# Patient Record
Sex: Female | Born: 1960 | Race: Black or African American | Hispanic: No | Smoking: Never smoker
Health system: Southern US, Community
[De-identification: ages and names within clinical notes are randomized; demographics above are authoritative.]

## PROBLEM LIST (undated history)

## (undated) ENCOUNTER — Emergency Department (HOSPITAL_BASED_OUTPATIENT_CLINIC_OR_DEPARTMENT_OTHER)

## (undated) DIAGNOSIS — R51 Headache: Secondary | ICD-10-CM

## (undated) DIAGNOSIS — E0500 Thyrotoxicosis with diffuse goiter without thyrotoxic crisis or storm: Secondary | ICD-10-CM

## (undated) DIAGNOSIS — T1491XA Suicide attempt, initial encounter: Secondary | ICD-10-CM

## (undated) DIAGNOSIS — R000 Tachycardia, unspecified: Secondary | ICD-10-CM

## (undated) DIAGNOSIS — K219 Gastro-esophageal reflux disease without esophagitis: Secondary | ICD-10-CM

## (undated) DIAGNOSIS — I499 Cardiac arrhythmia, unspecified: Secondary | ICD-10-CM

## (undated) DIAGNOSIS — I10 Essential (primary) hypertension: Secondary | ICD-10-CM

## (undated) DIAGNOSIS — Z9289 Personal history of other medical treatment: Secondary | ICD-10-CM

## (undated) DIAGNOSIS — F32A Depression, unspecified: Secondary | ICD-10-CM

## (undated) DIAGNOSIS — R7303 Prediabetes: Secondary | ICD-10-CM

## (undated) DIAGNOSIS — E039 Hypothyroidism, unspecified: Secondary | ICD-10-CM

## (undated) DIAGNOSIS — M1990 Unspecified osteoarthritis, unspecified site: Secondary | ICD-10-CM

## (undated) DIAGNOSIS — D649 Anemia, unspecified: Secondary | ICD-10-CM

## (undated) DIAGNOSIS — F419 Anxiety disorder, unspecified: Secondary | ICD-10-CM

## (undated) DIAGNOSIS — F329 Major depressive disorder, single episode, unspecified: Secondary | ICD-10-CM

## (undated) DIAGNOSIS — F410 Panic disorder [episodic paroxysmal anxiety] without agoraphobia: Secondary | ICD-10-CM

## (undated) HISTORY — PX: BACK SURGERY: SHX140

## (undated) HISTORY — PX: ABDOMINAL HYSTERECTOMY: SHX81

## (undated) HISTORY — PX: POSTERIOR LUMBAR FUSION: SHX6036

## (undated) HISTORY — PX: TONSILLECTOMY: SUR1361

## (undated) HISTORY — PX: DILATION AND CURETTAGE OF UTERUS: SHX78

---

## 2009-01-13 ENCOUNTER — Ambulatory Visit (HOSPITAL_COMMUNITY): Admission: RE | Admit: 2009-01-13 | Discharge: 2009-01-13 | Payer: Self-pay | Admitting: Family Medicine

## 2009-01-18 ENCOUNTER — Ambulatory Visit: Payer: Self-pay | Admitting: Surgery

## 2009-01-18 ENCOUNTER — Ambulatory Visit (HOSPITAL_COMMUNITY): Admission: RE | Admit: 2009-01-18 | Discharge: 2009-01-18 | Payer: Self-pay | Admitting: Family Medicine

## 2009-01-18 ENCOUNTER — Encounter (INDEPENDENT_AMBULATORY_CARE_PROVIDER_SITE_OTHER): Payer: Self-pay | Admitting: Family Medicine

## 2009-02-04 ENCOUNTER — Emergency Department (HOSPITAL_COMMUNITY): Admission: EM | Admit: 2009-02-04 | Discharge: 2009-02-04 | Payer: Self-pay | Admitting: Family Medicine

## 2009-03-04 ENCOUNTER — Encounter: Admission: RE | Admit: 2009-03-04 | Discharge: 2009-05-25 | Payer: Self-pay | Admitting: Neurological Surgery

## 2009-09-07 ENCOUNTER — Ambulatory Visit (HOSPITAL_COMMUNITY): Admission: RE | Admit: 2009-09-07 | Discharge: 2009-09-07 | Payer: Self-pay | Admitting: Family Medicine

## 2010-03-21 ENCOUNTER — Ambulatory Visit (HOSPITAL_COMMUNITY): Admission: RE | Admit: 2010-03-21 | Discharge: 2010-03-21 | Payer: Self-pay | Admitting: Family Medicine

## 2010-03-27 ENCOUNTER — Ambulatory Visit (HOSPITAL_COMMUNITY): Admission: RE | Admit: 2010-03-27 | Discharge: 2010-03-27 | Payer: Self-pay | Admitting: Family Medicine

## 2010-04-22 ENCOUNTER — Observation Stay (HOSPITAL_COMMUNITY): Admission: EM | Admit: 2010-04-22 | Discharge: 2010-04-23 | Payer: Self-pay | Admitting: Emergency Medicine

## 2010-04-22 ENCOUNTER — Ambulatory Visit: Payer: Self-pay | Admitting: Internal Medicine

## 2010-08-14 ENCOUNTER — Encounter (HOSPITAL_COMMUNITY)
Admission: RE | Admit: 2010-08-14 | Discharge: 2010-10-21 | Payer: Self-pay | Source: Home / Self Care | Attending: Internal Medicine | Admitting: Internal Medicine

## 2010-11-12 ENCOUNTER — Encounter: Payer: Self-pay | Admitting: Cardiovascular Disease

## 2011-01-07 LAB — BASIC METABOLIC PANEL
BUN: 11 mg/dL (ref 6–23)
BUN: 19 mg/dL (ref 6–23)
CO2: 28 mEq/L (ref 19–32)
CO2: 29 mEq/L (ref 19–32)
GFR calc non Af Amer: >60 mL/min (ref >60)
GFR calc non Af Amer: >60 mL/min (ref >60)
Glucose, Bld: 107 mg/dL — ABNORMAL HIGH (ref 70–99)
Glucose, Bld: 111 mg/dL — ABNORMAL HIGH (ref 70–99)
Potassium: 3.7 mEq/L (ref 3.5–5.1)
Potassium: 4.2 mEq/L (ref 3.5–5.1)

## 2011-01-07 LAB — DIFFERENTIAL
Basophils Absolute: 0 10*3/uL (ref 0.0–0.1)
Basophils Absolute: 0.1 10*3/uL (ref 0.0–0.1)
Basophils Relative: 0 % (ref 0–1)
Basophils Relative: 1 % (ref 0–1)
Eosinophils Absolute: 0.1 10*3/uL (ref 0.0–0.7)
Eosinophils Absolute: 0.2 10*3/uL (ref 0.0–0.7)
Eosinophils Relative: 1 % (ref 0–5)
Eosinophils Relative: 2 % (ref 0–5)
Monocytes Absolute: 1.2 10*3/uL — ABNORMAL HIGH (ref 0.1–1.0)
Monocytes Absolute: 1.6 10*3/uL — ABNORMAL HIGH (ref 0.1–1.0)
Monocytes Relative: 16 % — ABNORMAL HIGH (ref 3–12)
Neutro Abs: 2.4 10*3/uL (ref 1.7–7.7)
Neutro Abs: 6.3 10*3/uL (ref 1.7–7.7)

## 2011-01-07 LAB — POCT CARDIAC MARKERS
CKMB, poc: <1 ng/mL — ABNORMAL LOW (ref 1.0–8.0)
Troponin i, poc: <0.05 ng/mL (ref 0.00–0.09)

## 2011-01-07 LAB — CK TOTAL AND CKMB (NOT AT ARMC)
CK, MB: 1.5 ng/mL (ref 0.3–4.0)
Relative Index: INVALID (ref 0.0–2.5)

## 2011-01-07 LAB — T4, FREE: Free T4: 4.07 ng/dL — ABNORMAL HIGH (ref 0.80–1.80)

## 2011-01-07 LAB — CBC
HCT: 39.1 % (ref 36.0–46.0)
HCT: 41.9 % (ref 36.0–46.0)
MCH: 27.5 pg (ref 26.0–34.0)
MCH: 27.6 pg (ref 26.0–34.0)
MCHC: 32.6 g/dL (ref 30.0–36.0)
MCHC: 32.6 g/dL (ref 30.0–36.0)
MCV: 84.4 fL (ref 78.0–100.0)
RDW: 12.4 % (ref 11.5–15.5)
RDW: 12.4 % (ref 11.5–15.5)

## 2011-01-07 LAB — TROPONIN I
Troponin I: 0.01 ng/mL (ref 0.00–0.06)
Troponin I: 0.01 ng/mL (ref 0.00–0.06)

## 2011-01-07 LAB — LIPID PANEL: VLDL: 9 mg/dL (ref 0–40)

## 2011-01-07 LAB — BRAIN NATRIURETIC PEPTIDE: Pro B Natriuretic peptide (BNP): <30 pg/mL (ref 0.0–100.0)

## 2011-01-07 LAB — TSH: TSH: 0.006 u[IU]/mL — ABNORMAL LOW (ref 0.350–4.500)

## 2012-06-16 ENCOUNTER — Encounter (HOSPITAL_COMMUNITY): Payer: Self-pay | Admitting: *Deleted

## 2012-06-16 ENCOUNTER — Emergency Department (HOSPITAL_COMMUNITY)
Admission: EM | Admit: 2012-06-16 | Discharge: 2012-06-16 | Disposition: A | Discharge status: Home / Self Care | Payer: 59 | Source: Home / Self Care | Attending: Emergency Medicine | Admitting: Emergency Medicine

## 2012-06-16 DIAGNOSIS — K047 Periapical abscess without sinus: Secondary | ICD-10-CM

## 2012-06-16 MED ORDER — PENICILLIN V POTASSIUM 500 MG PO TABS: 500.0000 mg | ORAL_TABLET | Freq: Four times a day (QID) | ORAL | Status: DC

## 2012-06-16 MED ORDER — PENICILLIN V POTASSIUM 500 MG PO TABS: 500.0000 mg | ORAL_TABLET | Freq: Four times a day (QID) | ORAL | Status: AC

## 2012-06-16 MED ORDER — HYDROCODONE-ACETAMINOPHEN 5-500 MG PO TABS: 1.0000 | ORAL_TABLET | Freq: Four times a day (QID) | ORAL | Status: AC | PRN

## 2012-06-16 MED ORDER — HYDROCODONE-ACETAMINOPHEN 5-500 MG PO TABS: 1.0000 | ORAL_TABLET | Freq: Four times a day (QID) | ORAL | Status: DC | PRN

## 2012-06-16 NOTE — ED Notes (Signed)
Pt  Reports  l  Upper  Jaw  Pain      Pt  Reports    Pain  Started  yest  -  Pt  States    Her  Dentist  Advised  Her to  See  An orthodontist     But  The  Orthodontist  Was  On vacation   -  Pt  States     She  Was  Told      By her  Dentist  To come  To  An urgent care      Today       Pt  States  She  Has   A  Broken   Tooth  In past

## 2012-06-16 NOTE — ED Provider Notes (Signed)
History     CSN: [id number]  Arrival date & time 06/16/12  1105   First MD Initiated Contact with Patient 06/16/12 1112      Chief Complaint  Patient presents with  . Jaw Pain    (Consider location/radiation/quality/duration/timing/severity/associated sxs/prior treatment) HPI Comments: Patient reports that she's been having discomfort and pain I particular upper tooth for several days. It was not until yesterday that she started feeling intense throbbing pain coming from this particular tooth has been sensitive to temperatures and feeling pain now to the left side of her face. She denies noticing any swelling so far. Denies any fevers or chills or headaches. She has been trying with some over-the-counter medicine with no improvement.  The history is provided by the patient.    Past Medical History  Diagnosis Date  . Back pain     Past Surgical History  Procedure Date  . Abdominal hysterectomy   . Tonsillectomy     No family history on file.  History  Substance Use Topics  . Smoking status: Never Smoker   . Smokeless tobacco: Not on file  . Alcohol Use: Yes    OB History    Grav Para Term Preterm Abortions TAB SAB Ect Mult Living                  Review of Systems  Constitutional: Negative for activity change and appetite change.  HENT: Positive for dental problem. Negative for congestion, facial swelling, trouble swallowing and neck pain.     Allergies  Peanut-containing drug products  Home Medications   Current Outpatient Rx  Name Route Sig Dispense Refill  . CELEXA PO Oral Take by mouth.    . SYNTHROID PO Oral Take by mouth.    Marland Kitchen PROPRANOLOL HCL PO Oral Take by mouth.    Marland Kitchen HYDROCODONE-ACETAMINOPHEN 5-500 MG PO TABS Oral Take 1 tablet by mouth every 6 (six) hours as needed for pain. 30 tablet 0  . PENICILLIN V POTASSIUM 500 MG PO TABS Oral Take 1 tablet (500 mg total) by mouth 4 (four) times daily. 30 tablet 0    BP 137/80  Pulse 78  Temp 98.6 F  (37 C) (Oral)  Resp 16  SpO2 97%  Physical Exam  Nursing note and vitals reviewed. Constitutional: She appears well-developed and well-nourished.  HENT:  Head: No trismus in the jaw.    Mouth/Throat: No oral lesions. Abnormal dentition. Dental caries present.    Neck: Neck supple.  Lymphadenopathy:    She has no cervical adenopathy.  Neurological: She is alert.  Skin: Skin is warm. No erythema.    ED Course  Procedures (including critical care time)  Labs Reviewed - No data to display No results found.   1. Abscessed tooth       MDM  Patient with a left premolar upper fractured tooth with no occlussal surface- have been referred to see Dr. says several endodontist apparently is on vacation. Have discussed what symptoms should warrant her to go to the emergency department at prescribe a course of penicillin and Lortab for pain control. It is reported by her that her dentist sent her here for dental management?        Jimmie Molly, MD 06/16/12 [phone]

## 2012-08-15 ENCOUNTER — Other Ambulatory Visit (HOSPITAL_COMMUNITY): Payer: Self-pay | Admitting: Family Medicine

## 2012-08-15 DIAGNOSIS — M79604 Pain in right leg: Secondary | ICD-10-CM

## 2012-08-15 DIAGNOSIS — M79605 Pain in left leg: Secondary | ICD-10-CM

## 2012-08-20 ENCOUNTER — Ambulatory Visit (HOSPITAL_COMMUNITY)
Admission: RE | Admit: 2012-08-20 | Discharge: 2012-08-20 | Disposition: A | Discharge status: Home / Self Care | Payer: 59 | Source: Ambulatory Visit | Attending: Family Medicine | Admitting: Family Medicine

## 2012-08-20 DIAGNOSIS — M79605 Pain in left leg: Secondary | ICD-10-CM

## 2012-08-20 DIAGNOSIS — M79604 Pain in right leg: Secondary | ICD-10-CM

## 2012-08-20 DIAGNOSIS — M48061 Spinal stenosis, lumbar region without neurogenic claudication: Secondary | ICD-10-CM | POA: Insufficient documentation

## 2012-08-20 DIAGNOSIS — M4310 Spondylolisthesis, site unspecified: Secondary | ICD-10-CM | POA: Insufficient documentation

## 2012-09-26 ENCOUNTER — Other Ambulatory Visit (HOSPITAL_COMMUNITY): Payer: Self-pay | Admitting: Family Medicine

## 2012-09-26 DIAGNOSIS — Z1231 Encounter for screening mammogram for malignant neoplasm of breast: Secondary | ICD-10-CM

## 2012-10-03 ENCOUNTER — Ambulatory Visit (HOSPITAL_COMMUNITY)
Admission: RE | Admit: 2012-10-03 | Discharge: 2012-10-03 | Disposition: A | Discharge status: Home / Self Care | Payer: 59 | Source: Ambulatory Visit | Attending: Family Medicine | Admitting: Family Medicine

## 2012-10-03 DIAGNOSIS — Z1231 Encounter for screening mammogram for malignant neoplasm of breast: Secondary | ICD-10-CM | POA: Insufficient documentation

## 2012-10-31 ENCOUNTER — Other Ambulatory Visit: Payer: Self-pay | Admitting: Neurological Surgery

## 2012-12-02 ENCOUNTER — Encounter (HOSPITAL_COMMUNITY): Payer: Self-pay | Admitting: Pharmacy Technician

## 2012-12-09 ENCOUNTER — Encounter (HOSPITAL_COMMUNITY): Payer: Self-pay

## 2012-12-09 ENCOUNTER — Ambulatory Visit (HOSPITAL_COMMUNITY)
Admission: RE | Admit: 2012-12-09 | Discharge: 2012-12-09 | Disposition: A | Discharge status: Home / Self Care | Payer: 59 | Source: Ambulatory Visit | Attending: Anesthesiology | Admitting: Anesthesiology

## 2012-12-09 ENCOUNTER — Encounter (HOSPITAL_COMMUNITY)
Admission: RE | Admit: 2012-12-09 | Discharge: 2012-12-09 | Disposition: A | Discharge status: Home / Self Care | Payer: 59 | Source: Ambulatory Visit | Attending: Neurological Surgery | Admitting: Neurological Surgery

## 2012-12-09 DIAGNOSIS — R000 Tachycardia, unspecified: Secondary | ICD-10-CM | POA: Insufficient documentation

## 2012-12-09 DIAGNOSIS — Z01810 Encounter for preprocedural cardiovascular examination: Secondary | ICD-10-CM | POA: Insufficient documentation

## 2012-12-09 DIAGNOSIS — Z01812 Encounter for preprocedural laboratory examination: Secondary | ICD-10-CM | POA: Insufficient documentation

## 2012-12-09 DIAGNOSIS — Z01818 Encounter for other preprocedural examination: Secondary | ICD-10-CM | POA: Insufficient documentation

## 2012-12-09 HISTORY — DX: Headache: R51

## 2012-12-09 HISTORY — DX: Anxiety disorder, unspecified: F41.9

## 2012-12-09 HISTORY — DX: Cardiac arrhythmia, unspecified: I49.9

## 2012-12-09 HISTORY — DX: Hypothyroidism, unspecified: E03.9

## 2012-12-09 LAB — BASIC METABOLIC PANEL
BUN: 12 mg/dL (ref 6–23)
CO2: 28 mEq/L (ref 19–32)
Chloride: 102 mEq/L (ref 96–112)
Glucose, Bld: 100 mg/dL — ABNORMAL HIGH (ref 70–99)
Potassium: 3.6 mEq/L (ref 3.5–5.1)
Sodium: 139 mEq/L (ref 135–145)

## 2012-12-09 LAB — ABO/RH: ABO/RH(D): O POS

## 2012-12-09 LAB — CBC
HCT: 44.8 % (ref 36.0–46.0)
Hemoglobin: 14.2 g/dL (ref 12.0–15.0)
MCH: 26.9 pg (ref 26.0–34.0)
MCHC: 31.7 g/dL (ref 30.0–36.0)
MCV: 84.8 fL (ref 78.0–100.0)
RBC: 5.28 MIL/uL — ABNORMAL HIGH (ref 3.87–5.11)

## 2012-12-09 LAB — TYPE AND SCREEN: ABO/RH(D): O POS

## 2012-12-09 NOTE — Pre-Procedure Instructions (Signed)
Nancy Gibbs  12/09/2012   Your procedure is scheduled on:  Feb. 25, 2014  Report to [name] at 5:30 AM.  Call this number if you have problems the morning of surgery: [phone]   Remember:   Do not eat food or drink liquids after midnight.   Take these medicines the morning of surgery with A SIP OF WATER: citalopram(celexa), hydrocodone(vicodin), levothyroxine(synthroid), propranolol(inderal)   Do not wear jewelry, make-up or nail polish.  Do not wear lotions, powders, or perfumes. You may wear deodorant.  Do not shave 48 hours prior to surgery. Men may shave face and neck.  Do not bring valuables to the hospital.  Contacts, dentures or bridgework may not be worn into surgery.  Leave suitcase in the car. After surgery it may be brought to your room.  For patients admitted to the hospital, checkout time is 11:00 AM the day of  discharge.   Patients discharged the day of surgery will not be allowed to drive  home.  Name and phone number of your driver:   Special Instructions: Shower using CHG 2 nights before surgery and the night before surgery.  If you shower the day of surgery use CHG.  Use special wash - you have one bottle of CHG for all showers.  You should use approximately 1/3 of the bottle for each shower.   Please read over the following fact sheets that you were given: Pain Booklet, Coughing and Deep Breathing, Blood Transfusion Information and Surgical Site Infection Prevention

## 2012-12-15 MED ORDER — CEFAZOLIN SODIUM-DEXTROSE 2-3 GM-% IV SOLR
2.0000 g | INTRAVENOUS | Status: AC
  Administered 2012-12-16: 2 g via INTRAVENOUS
  Filled 2012-12-15: qty 50

## 2012-12-16 ENCOUNTER — Inpatient Hospital Stay (HOSPITAL_COMMUNITY): Payer: 59 | Admitting: Anesthesiology

## 2012-12-16 ENCOUNTER — Inpatient Hospital Stay (HOSPITAL_COMMUNITY): Payer: 59

## 2012-12-16 ENCOUNTER — Encounter (HOSPITAL_COMMUNITY)
Admission: RE | Disposition: A | Discharge status: Home / Self Care | Payer: Self-pay | Source: Ambulatory Visit | Attending: Neurological Surgery

## 2012-12-16 ENCOUNTER — Ambulatory Visit (HOSPITAL_COMMUNITY)
Admission: RE | Admit: 2012-12-16 | Discharge: 2012-12-18 | Disposition: A | Discharge status: Home / Self Care | Payer: 59 | Source: Ambulatory Visit | Attending: Neurological Surgery | Admitting: Neurological Surgery

## 2012-12-16 ENCOUNTER — Encounter (HOSPITAL_COMMUNITY): Payer: Self-pay | Admitting: Anesthesiology

## 2012-12-16 ENCOUNTER — Encounter (HOSPITAL_COMMUNITY): Payer: Self-pay | Admitting: *Deleted

## 2012-12-16 DIAGNOSIS — M4316 Spondylolisthesis, lumbar region: Secondary | ICD-10-CM

## 2012-12-16 DIAGNOSIS — M48061 Spinal stenosis, lumbar region without neurogenic claudication: Secondary | ICD-10-CM | POA: Insufficient documentation

## 2012-12-16 DIAGNOSIS — M4310 Spondylolisthesis, site unspecified: Principal | ICD-10-CM | POA: Insufficient documentation

## 2012-12-16 SURGERY — POSTERIOR LUMBAR FUSION 1 LEVEL
Anesthesia: General | Site: Back | Wound class: Clean

## 2012-12-16 MED ORDER — ACETAMINOPHEN 650 MG RE SUPP: 650.0000 mg | RECTAL | Status: DC | PRN

## 2012-12-16 MED ORDER — GLYCOPYRROLATE 0.2 MG/ML IJ SOLN
INTRAMUSCULAR | Status: DC | PRN
  Administered 2012-12-16: 0.6 mg via INTRAVENOUS

## 2012-12-16 MED ORDER — MENTHOL 3 MG MT LOZG: 1.0000 | LOZENGE | OROMUCOSAL | Status: DC | PRN

## 2012-12-16 MED ORDER — ONDANSETRON HCL 4 MG/2ML IJ SOLN: 4.0000 mg | Freq: Once | INTRAMUSCULAR | Status: DC | PRN

## 2012-12-16 MED ORDER — LIDOCAINE HCL (CARDIAC) 20 MG/ML IV SOLN
INTRAVENOUS | Status: DC | PRN
  Administered 2012-12-16: 70 mg via INTRAVENOUS

## 2012-12-16 MED ORDER — PHENOL 1.4 % MT LIQD: 1.0000 | OROMUCOSAL | Status: DC | PRN

## 2012-12-16 MED ORDER — ZOLPIDEM TARTRATE 5 MG PO TABS: 10.0000 mg | ORAL_TABLET | Freq: Every evening | ORAL | Status: DC | PRN

## 2012-12-16 MED ORDER — LACTATED RINGERS IV SOLN
INTRAVENOUS | Status: DC | PRN
  Administered 2012-12-16: 07:00:00 via INTRAVENOUS

## 2012-12-16 MED ORDER — HYDROMORPHONE HCL PF 1 MG/ML IJ SOLN
INTRAMUSCULAR | Status: AC
  Filled 2012-12-16: qty 1

## 2012-12-16 MED ORDER — SODIUM CHLORIDE 0.9 % IJ SOLN
3.0000 mL | Freq: Two times a day (BID) | INTRAMUSCULAR | Status: DC
  Administered 2012-12-17 – 2012-12-18 (×3): 3 mL via INTRAVENOUS

## 2012-12-16 MED ORDER — SODIUM CHLORIDE 0.9 % IR SOLN
Status: DC | PRN
  Administered 2012-12-16: 09:00:00

## 2012-12-16 MED ORDER — PROPRANOLOL HCL 1 MG/ML IV SOLN
INTRAVENOUS | Status: DC | PRN
  Administered 2012-12-16: .25 mg via INTRAVENOUS

## 2012-12-16 MED ORDER — HYDROMORPHONE HCL PF 1 MG/ML IJ SOLN
0.5000 mg | INTRAMUSCULAR | Status: DC | PRN
  Administered 2012-12-16 – 2012-12-17 (×2): 1 mg via INTRAVENOUS
  Filled 2012-12-16 (×2): qty 1

## 2012-12-16 MED ORDER — BISACODYL 10 MG RE SUPP: 10.0000 mg | Freq: Every day | RECTAL | Status: DC | PRN

## 2012-12-16 MED ORDER — FLEET ENEMA 7-19 GM/118ML RE ENEM
1.0000 | ENEMA | Freq: Once | RECTAL | Status: AC | PRN
  Filled 2012-12-16: qty 1

## 2012-12-16 MED ORDER — LIDOCAINE-EPINEPHRINE 1 %-1:100000 IJ SOLN
INTRAMUSCULAR | Status: DC | PRN
  Administered 2012-12-16: 10 mL via INTRADERMAL

## 2012-12-16 MED ORDER — SENNA 8.6 MG PO TABS
1.0000 | ORAL_TABLET | Freq: Two times a day (BID) | ORAL | Status: DC
  Administered 2012-12-16 – 2012-12-18 (×3): 8.6 mg via ORAL
  Filled 2012-12-16 (×6): qty 1

## 2012-12-16 MED ORDER — SODIUM CHLORIDE 0.9 % IV SOLN
INTRAVENOUS | Status: AC
  Filled 2012-12-16: qty 500

## 2012-12-16 MED ORDER — DEXAMETHASONE SODIUM PHOSPHATE 10 MG/ML IJ SOLN
10.0000 mg | Freq: Once | INTRAMUSCULAR | Status: AC
  Administered 2012-12-16: 10 mg via INTRAVENOUS

## 2012-12-16 MED ORDER — POLYETHYLENE GLYCOL 3350 17 G PO PACK
17.0000 g | PACK | Freq: Every day | ORAL | Status: DC
  Administered 2012-12-17 – 2012-12-18 (×2): 17 g via ORAL
  Filled 2012-12-16 (×3): qty 1

## 2012-12-16 MED ORDER — HYDROMORPHONE HCL PF 1 MG/ML IJ SOLN
0.2500 mg | INTRAMUSCULAR | Status: DC | PRN
  Administered 2012-12-16 (×3): 0.5 mg via INTRAVENOUS

## 2012-12-16 MED ORDER — SODIUM CHLORIDE 0.9 % IV SOLN: INTRAVENOUS | Status: DC

## 2012-12-16 MED ORDER — PROPOFOL 10 MG/ML IV BOLUS
INTRAVENOUS | Status: DC | PRN
  Administered 2012-12-16: 200 mg via INTRAVENOUS

## 2012-12-16 MED ORDER — SODIUM CHLORIDE 0.9 % IV SOLN
INTRAVENOUS | Status: DC | PRN
  Administered 2012-12-16: 11:00:00 via INTRAVENOUS

## 2012-12-16 MED ORDER — POLYETHYLENE GLYCOL 3350 17 G PO PACK
17.0000 g | PACK | Freq: Every day | ORAL | Status: DC | PRN
  Filled 2012-12-16: qty 1

## 2012-12-16 MED ORDER — PHENYLEPHRINE HCL 10 MG/ML IJ SOLN
INTRAMUSCULAR | Status: DC | PRN
  Administered 2012-12-16 (×2): 80 ug via INTRAVENOUS
  Administered 2012-12-16: 120 ug via INTRAVENOUS

## 2012-12-16 MED ORDER — THROMBIN 20000 UNITS EX SOLR
CUTANEOUS | Status: DC | PRN
  Administered 2012-12-16: 09:00:00 via TOPICAL

## 2012-12-16 MED ORDER — PROPRANOLOL HCL 10 MG PO TABS
10.0000 mg | ORAL_TABLET | ORAL | Status: DC
  Administered 2012-12-18: 10 mg via ORAL
  Filled 2012-12-16 (×2): qty 1

## 2012-12-16 MED ORDER — DOCUSATE SODIUM 100 MG PO CAPS
100.0000 mg | ORAL_CAPSULE | Freq: Two times a day (BID) | ORAL | Status: DC
  Administered 2012-12-16 – 2012-12-18 (×4): 100 mg via ORAL
  Filled 2012-12-16 (×4): qty 1

## 2012-12-16 MED ORDER — BUPIVACAINE HCL (PF) 0.5 % IJ SOLN
INTRAMUSCULAR | Status: DC | PRN
  Administered 2012-12-16: 20 mL
  Administered 2012-12-16: 10 mL

## 2012-12-16 MED ORDER — 0.9 % SODIUM CHLORIDE (POUR BTL) OPTIME
TOPICAL | Status: DC | PRN
  Administered 2012-12-16: 1000 mL

## 2012-12-16 MED ORDER — ZOLPIDEM TARTRATE 5 MG PO TABS
5.0000 mg | ORAL_TABLET | Freq: Every evening | ORAL | Status: DC | PRN
Start: 2012-12-16 — End: 2012-12-18

## 2012-12-16 MED ORDER — METHOCARBAMOL 500 MG PO TABS
500.0000 mg | ORAL_TABLET | Freq: Four times a day (QID) | ORAL | Status: DC | PRN
  Administered 2012-12-17 – 2012-12-18 (×4): 500 mg via ORAL
  Filled 2012-12-16 (×4): qty 1

## 2012-12-16 MED ORDER — CITALOPRAM HYDROBROMIDE 40 MG PO TABS
40.0000 mg | ORAL_TABLET | Freq: Every day | ORAL | Status: DC
  Administered 2012-12-17 – 2012-12-18 (×2): 40 mg via ORAL
  Filled 2012-12-16 (×3): qty 1

## 2012-12-16 MED ORDER — BACITRACIN 50000 UNITS IM SOLR
INTRAMUSCULAR | Status: AC
  Filled 2012-12-16: qty 1

## 2012-12-16 MED ORDER — NEOSTIGMINE METHYLSULFATE 1 MG/ML IJ SOLN
INTRAMUSCULAR | Status: DC | PRN
  Administered 2012-12-16: 3 mg via INTRAVENOUS

## 2012-12-16 MED ORDER — LIDOCAINE HCL 4 % MT SOLN
OROMUCOSAL | Status: DC | PRN
  Administered 2012-12-16: 4 mL via TOPICAL

## 2012-12-16 MED ORDER — ALUM & MAG HYDROXIDE-SIMETH 200-200-20 MG/5ML PO SUSP: 30.0000 mL | Freq: Four times a day (QID) | ORAL | Status: DC | PRN

## 2012-12-16 MED ORDER — SODIUM CHLORIDE 0.9 % IV SOLN: 250.0000 mL | INTRAVENOUS | Status: DC

## 2012-12-16 MED ORDER — SODIUM CHLORIDE 0.9 % IV SOLN
10.0000 mg | INTRAVENOUS | Status: DC | PRN
  Administered 2012-12-16: 10 ug/min via INTRAVENOUS

## 2012-12-16 MED ORDER — ROCURONIUM BROMIDE 100 MG/10ML IV SOLN
INTRAVENOUS | Status: DC | PRN
  Administered 2012-12-16 (×3): 20 mg via INTRAVENOUS
  Administered 2012-12-16: 30 mg via INTRAVENOUS

## 2012-12-16 MED ORDER — ONDANSETRON HCL 4 MG/2ML IJ SOLN
4.0000 mg | INTRAMUSCULAR | Status: DC | PRN
  Administered 2012-12-17: 4 mg via INTRAVENOUS
  Filled 2012-12-16: qty 2

## 2012-12-16 MED ORDER — METHOCARBAMOL 100 MG/ML IJ SOLN
500.0000 mg | Freq: Four times a day (QID) | INTRAVENOUS | Status: DC | PRN
  Filled 2012-12-16: qty 5

## 2012-12-16 MED ORDER — FENTANYL CITRATE 0.05 MG/ML IJ SOLN
INTRAMUSCULAR | Status: DC | PRN
  Administered 2012-12-16 (×3): 50 ug via INTRAVENOUS
  Administered 2012-12-16: 100 ug via INTRAVENOUS

## 2012-12-16 MED ORDER — ONDANSETRON HCL 4 MG/2ML IJ SOLN
INTRAMUSCULAR | Status: DC | PRN
  Administered 2012-12-16: 4 mg via INTRAVENOUS

## 2012-12-16 MED ORDER — CEFAZOLIN SODIUM 1-5 GM-% IV SOLN
1.0000 g | Freq: Three times a day (TID) | INTRAVENOUS | Status: AC
  Administered 2012-12-16 – 2012-12-17 (×2): 1 g via INTRAVENOUS
  Filled 2012-12-16 (×2): qty 50

## 2012-12-16 MED ORDER — SODIUM CHLORIDE 0.9 % IJ SOLN: 3.0000 mL | INTRAMUSCULAR | Status: DC | PRN

## 2012-12-16 MED ORDER — LEVOTHYROXINE SODIUM 50 MCG PO TABS
50.0000 ug | ORAL_TABLET | Freq: Every day | ORAL | Status: DC
  Administered 2012-12-17 – 2012-12-18 (×2): 50 ug via ORAL
  Filled 2012-12-16 (×3): qty 1

## 2012-12-16 MED ORDER — OXYCODONE-ACETAMINOPHEN 5-325 MG PO TABS
1.0000 | ORAL_TABLET | ORAL | Status: DC | PRN
  Administered 2012-12-16 – 2012-12-18 (×5): 2 via ORAL
  Filled 2012-12-16 (×5): qty 2

## 2012-12-16 MED ORDER — ACETAMINOPHEN 325 MG PO TABS: 650.0000 mg | ORAL_TABLET | ORAL | Status: DC | PRN

## 2012-12-16 MED ORDER — MIDAZOLAM HCL 5 MG/5ML IJ SOLN
INTRAMUSCULAR | Status: DC | PRN
  Administered 2012-12-16: 2 mg via INTRAVENOUS

## 2012-12-16 SURGICAL SUPPLY — 56 items
BAG DECANTER FOR FLEXI CONT (MISCELLANEOUS) ×2 IMPLANT
BLADE SURG ROTATE 9660 (MISCELLANEOUS) IMPLANT
BUR MATCHSTICK NEURO 3.0 LAGG (BURR) ×2 IMPLANT
CANISTER SUCTION 2500CC (MISCELLANEOUS) ×2 IMPLANT
CLOTH BEACON ORANGE TIMEOUT ST (SAFETY) ×2 IMPLANT
CONT SPEC 4OZ CLIKSEAL STRL BL (MISCELLANEOUS) ×4 IMPLANT
COVER BACK TABLE 24X17X13 BIG (DRAPES) IMPLANT
COVER TABLE BACK 60X90 (DRAPES) ×2 IMPLANT
DECANTER SPIKE VIAL GLASS SM (MISCELLANEOUS) ×2 IMPLANT
DERMABOND ADVANCED (GAUZE/BANDAGES/DRESSINGS) ×1
DERMABOND ADVANCED .7 DNX12 (GAUZE/BANDAGES/DRESSINGS) ×1 IMPLANT
DRAPE C-ARM 42X72 X-RAY (DRAPES) ×4 IMPLANT
DRAPE LAPAROTOMY 100X72X124 (DRAPES) ×2 IMPLANT
DRAPE POUCH INSTRU U-SHP 10X18 (DRAPES) ×2 IMPLANT
DRAPE PROXIMA HALF (DRAPES) IMPLANT
DURAPREP 26ML APPLICATOR (WOUND CARE) ×2 IMPLANT
ELECT REM PT RETURN 9FT ADLT (ELECTROSURGICAL) ×2
ELECTRODE REM PT RTRN 9FT ADLT (ELECTROSURGICAL) ×1 IMPLANT
GAUZE SPONGE 4X4 16PLY XRAY LF (GAUZE/BANDAGES/DRESSINGS) IMPLANT
GLOVE BIOGEL PI IND STRL 8.5 (GLOVE) ×2 IMPLANT
GLOVE BIOGEL PI INDICATOR 8.5 (GLOVE) ×2
GLOVE ECLIPSE 7.5 STRL STRAW (GLOVE) ×2 IMPLANT
GLOVE ECLIPSE 8.5 STRL (GLOVE) ×4 IMPLANT
GLOVE EXAM NITRILE LRG STRL (GLOVE) IMPLANT
GLOVE EXAM NITRILE MD LF STRL (GLOVE) IMPLANT
GLOVE EXAM NITRILE XL STR (GLOVE) IMPLANT
GLOVE EXAM NITRILE XS STR PU (GLOVE) IMPLANT
GOWN BRE IMP SLV AUR LG STRL (GOWN DISPOSABLE) IMPLANT
GOWN BRE IMP SLV AUR XL STRL (GOWN DISPOSABLE) IMPLANT
GOWN STRL REIN 2XL LVL4 (GOWN DISPOSABLE) ×4 IMPLANT
KIT BASIN OR (CUSTOM PROCEDURE TRAY) ×2 IMPLANT
KIT ROOM TURNOVER OR (KITS) ×2 IMPLANT
NEEDLE HYPO 22GX1.5 SAFETY (NEEDLE) ×2 IMPLANT
NS IRRIG 1000ML POUR BTL (IV SOLUTION) ×2 IMPLANT
PACK LAMINECTOMY NEURO (CUSTOM PROCEDURE TRAY) ×2 IMPLANT
PAD ARMBOARD 7.5X6 YLW CONV (MISCELLANEOUS) ×6 IMPLANT
PATTIES SURGICAL .5 X1 (DISPOSABLE) ×2 IMPLANT
PEEK PLIF NOVEL 9X25X12 (Peek) ×4 IMPLANT
ROD 35MM (Rod) ×2 IMPLANT
ROD TI 5X30MM (Rod) ×2 IMPLANT
SCREW 40MM (Screw) ×8 IMPLANT
SCREW SET SPINAL STD HEXALOBE (Screw) ×8 IMPLANT
SPONGE GAUZE 4X4 12PLY (GAUZE/BANDAGES/DRESSINGS) ×2 IMPLANT
SPONGE LAP 4X18 X RAY DECT (DISPOSABLE) IMPLANT
SPONGE SURGIFOAM ABS GEL 100 (HEMOSTASIS) ×2 IMPLANT
STRIP VITOSS 25X100X4MM (Neuro Prosthesis/Implant) ×2 IMPLANT
SUT VIC AB 1 CT1 18XBRD ANBCTR (SUTURE) ×1 IMPLANT
SUT VIC AB 1 CT1 8-18 (SUTURE) ×1
SUT VIC AB 2-0 CP2 18 (SUTURE) ×2 IMPLANT
SUT VIC AB 3-0 SH 8-18 (SUTURE) ×2 IMPLANT
SYR 20ML ECCENTRIC (SYRINGE) ×2 IMPLANT
TOWEL OR 17X24 6PK STRL BLUE (TOWEL DISPOSABLE) ×2 IMPLANT
TOWEL OR 17X26 10 PK STRL BLUE (TOWEL DISPOSABLE) ×2 IMPLANT
TRAP SPECIMEN MUCOUS 40CC (MISCELLANEOUS) ×2 IMPLANT
TRAY FOLEY CATH 14FRSI W/METER (CATHETERS) ×2 IMPLANT
WATER STERILE IRR 1000ML POUR (IV SOLUTION) ×2 IMPLANT

## 2012-12-16 NOTE — Anesthesia Procedure Notes (Signed)
Procedure Name: Intubation Date/Time: 12/16/2012 7:47 AM Performed by: Quentin Ore Pre-anesthesia Checklist: Patient identified, Emergency Drugs available, Suction available, Patient being monitored and Timeout performed Patient Re-evaluated:Patient Re-evaluated prior to inductionOxygen Delivery Method: Circle system utilized Preoxygenation: Pre-oxygenation with 100% oxygen Intubation Type: IV induction Ventilation: Mask ventilation without difficulty Laryngoscope Size: Mac and 3 Grade View: Grade II Tube type: Oral Tube size: 7.0 mm Number of attempts: 1 Airway Equipment and Method: Stylet and LTA kit utilized Placement Confirmation: ETT inserted through vocal cords under direct vision,  positive ETCO2 and breath sounds checked- equal and bilateral Secured at: 20 cm Tube secured with: Tape Dental Injury: Teeth and Oropharynx as per pre-operative assessment

## 2012-12-16 NOTE — H&P (Signed)
Vernella Niznik is an 52 y.o. female.   Chief Complaint: Back and bilateral lower extremity pain weakness in lower extremities with fatigue. 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  Past Medical History  Diagnosis Date  . Back pain   . Dysrhythmia     tachycardia  . Hypothyroidism   . Headache     if meds not taken at specific time  . Anxiety     Past Surgical History  Procedure Laterality Date  . Abdominal hysterectomy    . Tonsillectomy      History reviewed. No pertinent family history. Social History:  reports that she has never smoked. She does not have any smokeless tobacco history on file. She reports that  drinks alcohol. She reports that she does not use illicit drugs.  Allergies:  Allergies  Allergen Reactions  . Peanut-Containing Drug Products Anaphylaxis  . Pineapple Anaphylaxis  . Other     Some soaps and detergents, break out skin rashes.  Pt will bring own soap.    Medications Prior to Admission  Medication Sig Dispense Refill  . citalopram (CELEXA) 40 MG tablet Take 40 mg by mouth daily.      Marland Kitchen EPINEPHrine (EPIPEN) 0.3 mg/0.3 mL DEVI Inject 0.3 mg into the muscle once.      Marland Kitchen HYDROcodone-acetaminophen (VICODIN) 5-500 MG per tablet Take 1 tablet by mouth every 6 (six) hours as needed for pain. Pain      . levothyroxine (SYNTHROID, LEVOTHROID) 50 MCG tablet Take 50 mcg by mouth daily.      . propranolol (INDERAL) 10 MG tablet Take 10 mg by mouth every other day.      . zolpidem (AMBIEN) 10 MG tablet Take 10 mg by mouth at bedtime as needed for sleep. sleep      . polyethylene glycol (MIRALAX / GLYCOLAX) packet Take 17 g by mouth daily.        No results found for this or any previous visit  (from the past 48 hour(s)). No results found.  Review of Systems  HENT: Negative.   Eyes: Negative.   Respiratory: Negative.   Cardiovascular: Negative.   Gastrointestinal: Negative.   Genitourinary: Negative.   Musculoskeletal: Positive for back pain.  Skin: Negative.   Neurological: Positive for tingling, focal weakness and weakness.  Endo/Heme/Allergies: Negative.   Psychiatric/Behavioral: Negative.     Blood pressure 140/96, pulse 60, temperature 97.6 F (36.4 C), temperature source Oral, resp. rate 18, SpO2 99.00%. Physical Exam  Constitutional: She is oriented to person, place, and time. She appears well-developed and well-nourished.  HENT:  Head: Normocephalic and atraumatic.  Eyes: Conjunctivae and EOM are normal. Pupils are equal, round, and reactive to light.  Neck: Normal range of motion. Neck supple.  Cardiovascular: Normal rate and regular rhythm.   GI: Soft. Bowel sounds are normal.  Musculoskeletal:  Back pain with radiation to both buttocks and posterior lower extremities. Tender to palpation in midline.  Neurological: She is alert and oriented to person, place, and time. She has normal reflexes.  Skin: Skin is warm and dry.  Psychiatric: She has a normal mood and affect. Her behavior is normal. Judgment and thought content normal.     Assessment/Plan  I have visited with her back in May  of 2010.  At that time I noted that she had a mild Grade I spondylolisthesis with some lateral recess stenosis and mostly she was having back pain with some radiation into her hip and leg on the right side.  I advised conservative treatment and she and been seen by Dr. Alvester Morin and has had some epidural steroid injections done in the lower lumbar spine.  She notes that these have given her some relief during that time, but more recently she has had episodes of the legs giving way and she has had a couple of falls.  She notes her back pain and leg pain have been increasing also.  She  is sent via the courtesy of Dr. Carolin Coy for further evaluation of this problem having had a recent MRI in October of 2013.  I have had the opportunity to review this and this demonstrates that she now has advanced Grade I spondylolisthesis and she has severe central stenosis, in addition to biforaminal stenosis.  There is marked hypertrophy of the superior articular process of L5 which causes the lateral recess stenosis mostly.    To further her workup today in the office, I obtained a new flexion and extension lumbar spine x-ray.  This demonstrates that she has 10 mm. of anterolisthesis of L4 on 5 that does not change much between flexion and extension perhaps at most 1 mm. or so.  The alignment in the coronal and sagittal planes is quite good, save for the spondylolisthesis at L4-L5.    On examination I note that she walks without any antalgia.  She does have some mild tibialis anterior weakness on testing both these muscle groups.  Her flexion forward is intact to +90 degrees.  Palpation, however, and percussion do reproduce some localized tenderness in her back.    IMPRESSION:    Jaqlyn Gruenhagen has a Grade I spondylolisthesis that has been treated conservatively.  A recent MRI demonstrates that there has been some advancement of the spondylitic and stenotic process at this level.  She is now having increasing neurologic symptoms.  She has had extensive efforts at conservative management with steroid injections and back in 2010 she was evaluated at [facility] with a McKenzie exercise program.  At this time, I do not fee that further conservative management is likely to yield much improvement, although if she could tolerate it, she could certainly consider this.  She is here because she notes that she is having to miss work frequently, the pain symptoms are increasing and her functional status is decreasing substantially.  I indicated that the definitiverepair of this  would require surgical decompression of the spondylolisthesis at L4-L5 with interbody grafting and placement of spacers into the disc space at L4-L5, then placement of pedicle screws in the vertebrae at L4-L5 connecting them with short rods.  I demonstrated the procedure and process in the model in the office.  I indicated that surgery is, indeed, a major undertaking.  The patient would be required to fuse that is grown bone across one disc space to the other in order to form a fusion.  She otherwise appears to have good health and should be fully capable of doing this, but the surgery does certainly have certain risks.  These include the possibility of nerve injury, the possibility that she wouldn't heal, which would require further surgery in order to stabilize and get her spine to heal.  There is a possibility always of infection.  All of these  risks are very small and given the fact that she is fairly conservative efforts, I believe that the surgical intervention is worth the risk.  Nonetheless, at this time, because she is neurologically intact, surgery is elective in nature.  The main reason for proceeding is because of poor management of the symptoms and this is what appears to be happening in her case.    Kincaid Tiger J 12/16/2012, 7:34 AM

## 2012-12-16 NOTE — Addendum Note (Signed)
Addendum created 12/16/12 1230 by Quentin Ore, CRNA   Modules edited: Anesthesia Medication Administration

## 2012-12-16 NOTE — Progress Notes (Signed)
Patient ID: Nancy Gibbs, female   DOB: 1961/10/20, 52 y.o.   MRN: [id number] I will signs are stable. Patient appears comfortable. Describes no leg pain. Back has some modest soreness. Motor function appears good and lower extremities. Mobilize as tolerated.

## 2012-12-16 NOTE — Progress Notes (Signed)
Informed Dr. Jacklynn Bue that pt. Didn't take beta blocker this am. Pule rate 50, blood pressure 140/96. He stated not to give beta blocker.

## 2012-12-16 NOTE — Anesthesia Preprocedure Evaluation (Addendum)
Anesthesia Evaluation  Patient identified by MRN, date of birth, ID band Patient awake    Reviewed: Allergy & Precautions, H&P , NPO status , Patient's Chart, lab work & pertinent test results, reviewed documented beta blocker date and time   Airway Mallampati: II TM Distance: >3 FB Neck ROM: full    Dental  (+) Teeth Intact   Pulmonary          Cardiovascular + dysrhythmias Rhythm:regular Rate:Normal     Neuro/Psych  Headaches, Anxiety    GI/Hepatic   Endo/Other  Hypothyroidism   Renal/GU      Musculoskeletal   Abdominal   Peds  Hematology   Anesthesia Other Findings   Reproductive/Obstetrics                         Anesthesia Physical Anesthesia Plan  ASA: II  Anesthesia Plan: General   Post-op Pain Management:    Induction: Intravenous  Airway Management Planned: Oral ETT  Additional Equipment:   Intra-op Plan:   Post-operative Plan: Extubation in OR  Informed Consent: I have reviewed the patients History and Physical, chart, labs and discussed the procedure including the risks, benefits and alternatives for the proposed anesthesia with the patient or authorized representative who has indicated his/her understanding and acceptance.     Plan Discussed with: CRNA, Anesthesiologist and Surgeon  Anesthesia Plan Comments:         Anesthesia Quick Evaluation

## 2012-12-16 NOTE — Transfer of Care (Signed)
Immediate Anesthesia Transfer of Care Note  Patient: Nancy Gibbs  Procedure(s) Performed: Procedure(s) with comments: POSTERIOR LUMBAR FUSION 1 LEVEL (N/A) - L4-5 Decompression/Fusion/Posterior lumbar interbody fusion/Peek spacers/Pedicle screw fixation, C ARM  Patient Location: PACU  Anesthesia Type:General  Level of Consciousness: sedated  Airway & Oxygen Therapy: Patient Spontanous Breathing and Patient connected to nasal cannula oxygen  Post-op Assessment: Report given to PACU RN, Post -op Vital signs reviewed and stable and Patient moving all extremities X 4  Post vital signs: Reviewed and stable  Complications: No apparent anesthesia complications

## 2012-12-16 NOTE — Op Note (Signed)
Preoperative diagnosis: L4-5 degenerative spondylolisthesis with central and foraminal stenosis of L4 and L5 nerve roots with radiculopathy., Back pain. Postoperative diagnosis: Same  Procedure: L4-L5 decompressive laminectomy decompression of L4 and L5 nerve roots, posterior lumbar interbody arthrodesis with peek spacers local autograft and allograft, pedicle screw fixation L4-L5, posterior lateral arthrodesis L4-L5  Surgeon: Barnett Abu M.D.  Asst.: Tressie Stalker M.D.  Indications: Patient is Nancy Gibbs is a 52 y.o. female who who's had significant back pain and lumbar radiculopathy for over a years period time. A lumbar myelogram demonstrates advanced spondylolisthesis with high-grade canal stenosis. she was advised regarding surgical intervention.  Procedure: The patient was brought to the operating room supine on a stretcher. After the smooth induction of general endotracheal anesthesia she was turned prone and the back was prepped with alcohol and DuraPrep. The back was then draped sterilely. A midline incision was created and carried down to the lumbar dorsal fascia. A localizing radiograph identified the L4 and L5 spinous processes. A subligamentous dissection was created at L4 and L5 to expose the interlaminar space at L4 and L5 and the facet joints over the L4-L5 interspace. Laminotomies were were then created removing the entire inferior margin of the lamina of L4 including the inferior facet at the L4-L5 joint. The yellow ligament was taken up and the common dural tube was exposed along with the L4 nerve root superiorly, and the L5 nerve root inferiorly, the disc space was exposed and epidural veins in this region were cauterized and divided. The L4 nerve roots and the L5 nerve root were dissected with care taken to protect them, and decompress severe stenosis at each of the root exit zones. On the right side particularly there was severe disfiguration of the facet joint which caused  scar of dictation to occur along the L4 nerve root superiorly and then the common dural tube and the take off of the L5 nerve root inferiorly. This was carefully dissected free with a combination of a high-speed drill and small Kerrison punches. The dissection was much more than was require for simple performance of an interbody fusion.   The disc space was opened and a combination of curettes and rongeurs was used to evacuate the disc space fully. The endplates were removed using sharp curettes. An interbody spacer was placed to distract the disc space while the contralateral discectomy was performed. When the entirety of the disc was removed and the endplates were prepared final sizing of the disc space was obtained 12 mm peek spacers were chosen and packed with autograft and allograft and placed into the interspace. The remainder of the interspace was packed with autograft and allograft. Pedicle entry sites were then chosen using fluoroscopic guidance and 6.5 x 40 mm screws were placed in L4 and 6.5 x 40 mm screws were placed in L5. The lateral gutters were decorticated and graft was packed in the posterolateral gutters between L4 and L5. Final radiographs were obtained after placing appropriately sized rods between the pedicle screws at L4-L5 and torquing these to the appropriate tension. The surgical site was inspected carefully to assure the L4 and L5 nerve roots were well decompressed, hemostasis was obtained, and the graft was well packed. Then the retractors were removed and the wound was closed with #1 Vicryl in the lumbar dorsal fascia 2-0 Vicryl in the subcutaneous tissue and 3-0 Vicryl subcuticularly. When he cc of half percent Marcaine was injected into the paraspinous musculature at the time of closure. Blood loss was estimated  at 250 cc. The patient tolerated procedure well and was returned to the recovery room in stable condition.

## 2012-12-16 NOTE — Preoperative (Signed)
Beta Blockers   Reason not to administer Beta Blockers:Concurrent use of intravenous inotropic medications during the perioperative 

## 2012-12-16 NOTE — Anesthesia Postprocedure Evaluation (Signed)
  Anesthesia Post-op Note  Patient: Nancy Gibbs  Procedure(s) Performed: Procedure(s) with comments: POSTERIOR LUMBAR FUSION 1 LEVEL (N/A) - L4-5 Decompression/Fusion/Posterior lumbar interbody fusion/Peek spacers/Pedicle screw fixation, C ARM  Patient Location: PACU  Anesthesia Type:General  Level of Consciousness: awake  Airway and Oxygen Therapy: Patient Spontanous Breathing  Post-op Pain: mild  Post-op Assessment: Post-op Vital signs reviewed, Patient's Cardiovascular Status Stable, Respiratory Function Stable, Patent Airway, No signs of Nausea or vomiting and Pain level controlled  Post-op Vital Signs: stable  Complications: No apparent anesthesia complications

## 2012-12-17 NOTE — Progress Notes (Signed)
Subjective: Patient reports Centralized back pain mostly no complaints of any radiculopathy  Objective: Vital signs in last 24 hours: Temp:  [97.2 F (36.2 C)-98.9 F (37.2 C)] 98.9 F (37.2 C) (02/26 0804) Pulse Rate:  [53-81] 73 (02/26 0804) Resp:  [4-36] 16 (02/26 0804) BP: (103-147)/(59-104) 121/79 mmHg (02/26 0804) SpO2:  [91 %-100 %] 97 % (02/26 0804)  Intake/Output from previous day: 02/25 0701 - 02/26 0700 In: 1900 [I.V.:1700; Blood:200] Out: 3930 [Urine:3430; Blood:500] Intake/Output this shift:    Incision is clean and dry. Motor function appears intact in major groups including iliopsoas quadriceps tibialis anterior and gastrocs the  Lab Results: No results found for this basename: WBC, HGB, HCT, PLT,  in the last 72 hours BMET No results found for this basename: NA, K, CL, CO2, GLUCOSE, BUN, CREATININE, CALCIUM,  in the last 72 hours  Studies/Results: Dg Lumbar Spine 2-3 Views  12/16/2012  *RADIOLOGY REPORT*  Clinical Data: Back pain  LUMBAR SPINE - 2-3 VIEW  Comparison: Multiple prior  Findings:  Film #1 demonstrates a needle at L4-L5.  Film #2 demonstrates a slightly angled probe at L4-L5.  IMPRESSION: As above.   Original Report Authenticated By: Davonna Belling, M.D.    Dg Lumbar Spine 2-3 Views  12/16/2012  *RADIOLOGY REPORT*  Clinical Data: Back pain  DG C-ARM 1-60 MIN,LUMBAR SPINE - 2-3 VIEW  Comparison: Multiple priors.  Findings: C-arm films document L4-5 posterolateral and interbody fusion.  IMPRESSION: Satisfactory position and alignment.   Original Report Authenticated By: Davonna Belling, M.D.    Dg C-arm 1-60 Min  12/16/2012  *RADIOLOGY REPORT*  Clinical Data: Back pain  DG C-ARM 1-60 MIN,LUMBAR SPINE - 2-3 VIEW  Comparison: Multiple priors.  Findings: C-arm films document L4-5 posterolateral and interbody fusion.  IMPRESSION: Satisfactory position and alignment.   Original Report Authenticated By: Davonna Belling, M.D.     Assessment/Plan: Stable postop day 1.  LOS: 1 day  Encourage mobilization with physical therapy occupational therapy and nursing.   Perle Gibbon J 12/17/2012, 9:10 AM

## 2012-12-17 NOTE — Evaluation (Signed)
Occupational Therapy Evaluation Patient Details Name: Nancy Gibbs MRN: [id number] DOB: 1961-09-21 Today's Date: 12/17/2012 Time: 6295-2841 OT Time Calculation (min): 34 min  OT Assessment / Plan / Recommendation Clinical Impression  52 yo female s/p L4-5 PLIF that coudl benefit from skilled Ot acutely. Recommend HHOT for d/c planning    OT Assessment  Patient needs continued OT Services    Follow Up Recommendations  Home health OT    Barriers to Discharge      Equipment Recommendations  3 in 1 bedside comode;Other (comment) (RW)    Recommendations for Other Services    Frequency  Min 2X/week    Precautions / Restrictions Precautions Precautions: Back;Fall Required Braces or Orthoses: Spinal Brace Spinal Brace: Lumbar corset;Applied in sitting position Restrictions Weight Bearing Restrictions: No   Pertinent Vitals/Pain Severe pain 9 out 10 RN ashley in room providing medication    ADL  Eating/Feeding: Set up Where Assessed - Eating/Feeding: Chair Grooming: Wash/dry face;Supervision/safety Where Assessed - Grooming: Unsupported standing Lower Body Dressing: Maximal assistance Where Assessed - Lower Body Dressing: Unsupported sitting (unable to cross bil LE) Toilet Transfer: Minimal assistance Toilet Transfer Method: Sit to stand Toilet Transfer Equipment: Regular height toilet (provided 3n1 for comfort/ borrowed from [employer] unit) Toileting - Architect and Hygiene: Min guard Where Assessed - Toileting Clothing Manipulation and Hygiene: Sit to stand from 3-in-1 or toilet Equipment Used:  (hand held (A) from daughter) Transfers/Ambulation Related to ADLs: Pt ambulating to restroom with hand held (A) from daughter. pt positioned in chair with pillows. pt educated on proper positioning to decr pain and back safety ADL Comments: pt educated on back precautions with adls and proper body positioning. pt with severe pain limiting mobility. pt completed toilet  transfer and hand hygiene. pt provided 3n1 and chair in room. pt could benefit next session on AE education bed mobility and toilet aid    OT Diagnosis: Generalized weakness;Acute pain  OT Problem List: Decreased strength;Decreased activity tolerance;Impaired balance (sitting and/or standing);Decreased safety awareness;Decreased knowledge of use of DME or AE;Decreased knowledge of precautions;Pain OT Treatment Interventions: Self-care/ADL training;DME and/or AE instruction;Therapeutic activities;Patient/family education;Balance training   OT Goals Acute Rehab OT Goals OT Goal Formulation: With patient Time For Goal Achievement: 12/31/12 Potential to Achieve Goals: Good ADL Goals Pt Will Perform Lower Body Bathing: with modified independence;Sit to stand from chair;with adaptive equipment ADL Goal: Lower Body Bathing - Progress: Goal set today Pt Will Perform Lower Body Dressing: with modified independence;Sit to stand from chair;with adaptive equipment ADL Goal: Lower Body Dressing - Progress: Goal set today Pt Will Perform Toileting - Hygiene: with modified independence;Sit to stand from 3-in-1/toilet;with adaptive equipment ADL Goal: Toileting - Hygiene - Progress: Goal set today Miscellaneous OT Goals Miscellaneous OT Goal #1: Pt will complete bed mobility mod i as precursor to adls OT Goal: Miscellaneous Goal #1 - Progress: Goal set today  Visit Information  Last OT Received On: 12/17/12 Assistance Needed: +1    Subjective Data  Subjective: "it just hurts so bad when changing positions" Patient Stated Goal: to return home to daughter house   Prior Functioning     Home Living Lives With: Alone Available Help at Discharge: Family Type of Home: Apartment Home Access: Stairs to enter Secretary/administrator of Steps: 1 flight Entrance Stairs-Rails: Right Home Layout: One level Bathroom Shower/Tub: Engineer, manufacturing systems: Standard Home Adaptive Equipment:  None Prior Function Level of Independence: Independent Able to Take Stairs?: Yes Driving: Yes Vocation: Full time employment Comments: works  as RN on [employer] Communication Communication: No difficulties Dominant Hand: Right         Vision/Perception Vision - History Baseline Vision: No visual deficits Patient Visual Report: No change from baseline   Cognition  Cognition Overall Cognitive Status: Appears within functional limits for tasks assessed/performed Arousal/Alertness: Awake/alert Orientation Level: Appears intact for tasks assessed Behavior During Session: [facility] for tasks performed    Extremity/Trunk Assessment Right Upper Extremity Assessment RUE ROM/Strength/Tone: Within functional levels RUE Sensation: WFL - Light Touch RUE Coordination: WFL - gross/fine motor Left Upper Extremity Assessment LUE ROM/Strength/Tone: Within functional levels LUE Sensation: WFL - Light Touch LUE Coordination: WFL - gross/fine motor     Mobility Bed Mobility Bed Mobility: Not assessed Transfers Sit to Stand: 4: Min assist;With upper extremity assist;From bed Stand to Sit: 4: Min assist;With upper extremity assist;To chair/3-in-1 Details for Transfer Assistance: incr time and pain     Exercise     Balance     End of Session OT - End of Session Activity Tolerance: Patient limited by pain Patient left: in chair;with call bell/phone within reach;with family/visitor present Nurse Communication: Mobility status;Precautions  GO Functional Assessment Tool Used: clinical judgement Functional Limitation: Self care Self Care Current Status (J4782): At least 20 percent but less than 40 percent impaired, limited or restricted Self Care Goal Status (N5621): At least 20 percent but less than 40 percent impaired, limited or restricted   Lucile Shutters 12/17/2012, 3:51 PM Pager: [phone]

## 2012-12-17 NOTE — Evaluation (Signed)
Physical Therapy Evaluation Patient Details Name: Nancy Gibbs MRN: [id number] DOB: 1961/02/01 Today's Date: 12/17/2012 Time: 6962-9528 PT Time Calculation (min): 22 min  PT Assessment / Plan / Recommendation Clinical Impression  Pt s/p lumbar fusion.  Needs skilled PT to maximize I and safety so pt can return home.  Expect pt will make steady progress.  If still moving slowly tomorrow may need rolling walker and HHPT.    PT Assessment  Patient needs continued PT services    Follow Up Recommendations  Home health PT (possibly)    Does the patient have the potential to tolerate intense rehabilitation      Barriers to Discharge        Equipment Recommendations  Rolling walker with 5" wheels (possibly)    Recommendations for Other Services     Frequency Min 3X/week    Precautions / Restrictions Precautions Precautions: Back;Fall Required Braces or Orthoses: Spinal Brace Spinal Brace: Lumbar corset;Applied in sitting position   Pertinent Vitals/Pain 7/10 back pain      Mobility  Bed Mobility Bed Mobility: Right Sidelying to Sit;Sitting - Scoot to Edge of Bed Right Sidelying to Sit: 4: Min assist Sitting - Scoot to [place] of Bed: 4: Min guard Details for Bed Mobility Assistance: Verbal cues for technique and incr time to perform Transfers Transfers: Sit to Stand;Stand to Sit Sit to Stand: 4: Min assist;With upper extremity assist;From bed Stand to Sit: 4: Min assist;With upper extremity assist;To bed Details for Transfer Assistance: Incr time Ambulation/Gait Ambulation/Gait Assistance: 4: Min Environmental consultant (Feet): 150 Feet Assistive device: 1 person hand held assist;None Gait Pattern: Step-through pattern;Decreased step length - right;Decreased step length - left Gait velocity: very slow    Exercises     PT Diagnosis: Difficulty walking;Acute pain  PT Problem List: Decreased activity tolerance;Decreased mobility;Decreased balance;Decreased knowledge  of precautions;Decreased knowledge of use of DME;Pain PT Treatment Interventions: DME instruction;Gait training;Stair training;Functional mobility training;Therapeutic activities;Therapeutic exercise;Balance training;Patient/family education   PT Goals Acute Rehab PT Goals PT Goal Formulation: With patient Time For Goal Achievement: 12/19/12 Potential to Achieve Goals: Good Pt will go Supine/Side to Sit: with supervision PT Goal: Supine/Side to Sit - Progress: Goal set today Pt will go Sit to Supine/Side: with supervision PT Goal: Sit to Supine/Side - Progress: Goal set today Pt will go Sit to Stand: with supervision PT Goal: Sit to Stand - Progress: Goal set today Pt will go Stand to Sit: with supervision PT Goal: Stand to Sit - Progress: Goal set today Pt will Ambulate: 51 - 150 feet;with supervision;with least restrictive assistive device PT Goal: Ambulate - Progress: Goal set today Pt will Go Up / Down Stairs: 6-9 stairs;with min assist;with least restrictive assistive device PT Goal: Up/Down Stairs - Progress: Goal set today  Visit Information  Last PT Received On: 12/17/12 Assistance Needed: +1    Subjective Data  Subjective: Pt states getting up is what hurts. Patient Stated Goal: Return home   Prior Functioning  Home Living Lives With: Alone (will be going to daughters home) Available Help at Discharge: Family Type of Home: Apartment Home Access: Stairs to enter Entrance Stairs-Number of Steps: 1 flight Entrance Stairs-Rails: Right Home Layout: One level Bathroom Shower/Tub: Engineer, manufacturing systems: Standard Home Adaptive Equipment: None Prior Function Level of Independence: Independent Able to Take Stairs?: Yes Driving: Yes Vocation: Full time employment Comments: works as Engineer, civil (consulting) on [employer] Communication: No difficulties    Cognition  Cognition Overall Cognitive Status: Appears within functional limits  for tasks  assessed/performed Arousal/Alertness: Awake/alert Orientation Level: Appears intact for tasks assessed Behavior During Session: [facility] for tasks performed    Extremity/Trunk Assessment Right Lower Extremity Assessment RLE ROM/Strength/Tone: Unable to fully assess;Due to pain Left Lower Extremity Assessment LLE ROM/Strength/Tone: Unable to fully assess;Due to pain   Balance Balance Balance Assessed: Yes Static Standing Balance Static Standing - Balance Support: No upper extremity supported Static Standing - Level of Assistance: 5: Stand by assistance  End of Session PT - End of Session Equipment Utilized During Treatment: Back brace Activity Tolerance: Patient limited by pain Patient left: in bed;with call bell/phone within reach;with family/visitor present (pt sitting EOB) Nurse Communication: Mobility status  GP     Nancy Gibbs 12/17/2012, 10:00 AM  Nancy Gibbs PT [phone]

## 2012-12-18 MED ORDER — OXYCODONE-ACETAMINOPHEN 5-325 MG PO TABS: 1.0000 | ORAL_TABLET | ORAL | Status: DC | PRN

## 2012-12-18 MED ORDER — DIAZEPAM 5 MG PO TABS: 5.0000 mg | ORAL_TABLET | Freq: Four times a day (QID) | ORAL | Status: DC | PRN

## 2012-12-18 MED FILL — Heparin Sodium (Porcine) Inj 1000 Unit/ML: INTRAMUSCULAR | Qty: 30 | Status: AC

## 2012-12-18 MED FILL — Sodium Chloride IV Soln 0.9%: INTRAVENOUS | Qty: 1000 | Status: AC

## 2012-12-18 MED FILL — Sodium Chloride Irrigation Soln 0.9%: Qty: 3000 | Status: AC

## 2012-12-18 NOTE — Progress Notes (Signed)
Physical Therapy Treatment Patient Details Name: Nancy Gibbs MRN: [id number] DOB: 1961/09/18 Today's Date: 12/18/2012 Time: 4098-1191 PT Time Calculation (min): 20 min  PT Assessment / Plan / Recommendation Comments on Treatment Session  Pt. jhas been ambulating with staff with no RW. Pt has access to rw and will get it as will allow pt. to be more independent when gets home. Pt. has practiced  steps and is ready for DC to daughter's home in [place]. Reviewed to place pillows under arms in sitting for support.    Follow Up Recommendations  Home health PT     Does the patient have the potential to tolerate intense rehabilitation     Barriers to Discharge        Equipment Recommendations  None recommended by PT    Recommendations for Other Services    Frequency     Plan All goals met and education completed, patient dischaged from PT services    Precautions / Restrictions Precautions Precautions: Back;Fall Required Braces or Orthoses: Spinal Brace Spinal Brace: Lumbar corset;Applied in sitting position   Pertinent Vitals/Pain Pain is 4 in back    Mobility  Bed Mobility Bed Mobility: Rolling Right;Sit to Sidelying Right Rolling Right: 5: Supervision;With rail Rolling Left: 5: Supervision Right Sidelying to Sit: 4: Min guard Sitting - Scoot to Edge of Bed: 5: Supervision Sit to Sidelying Right: 5: Supervision Details for Bed Mobility Assistance: Verbal cues for technique and incr time to perform Transfers Sit to Stand: 4: Min guard;From bed Stand to Sit: 5: Supervision;With upper extremity assist;To bed Details for Transfer Assistance: incr time  Ambulation/Gait Ambulation/Gait Assistance: 4: Min guard Ambulation Distance (Feet): 75 Feet Assistive device: 1 person hand held assist;None Ambulation/Gait Assistance Details: incr. time for ambulation, holds onto rail also at times, Gait Pattern: Step-through pattern;Decreased step length - right;Decreased step length -  left Gait velocity: very slow Stairs: Yes Stairs Assistance: 3: Mod assist Stairs Assistance Details (indicate cue type and reason): +1 handhold for going up, mod support going up, coming down with L rail and min guard. Stair Management Technique: No rails;One rail Left;Step to pattern Number of Stairs: 4    Exercises     PT Diagnosis:    PT Problem List:   PT Treatment Interventions:     PT Goals Acute Rehab PT Goals Pt will go Supine/Side to Sit: with supervision PT Goal: Supine/Side to Sit - Progress: Progressing toward goal Pt will go Sit to Supine/Side: with supervision PT Goal: Sit to Supine/Side - Progress: Met Pt will go Sit to Stand: with supervision PT Goal: Sit to Stand - Progress: Progressing toward goal Pt will go Stand to Sit: with supervision PT Goal: Stand to Sit - Progress: Met Pt will Ambulate: 51 - 150 feet;with supervision;with least restrictive assistive device PT Goal: Ambulate - Progress: Progressing toward goal Pt will Go Up / Down Stairs: 6-9 stairs;with min assist;with least restrictive assistive device (pt practiced 4 which is sufficient.) PT Goal: Up/Down Stairs - Progress: Progressing toward goal  Visit Information  Last PT Received On: 12/18/12 Assistance Needed: +1    Subjective Data  Subjective: I get stiff,    Cognition  Cognition Overall Cognitive Status: Appears within functional limits for tasks assessed/performed Arousal/Alertness: Awake/alert Orientation Level: Appears intact for tasks assessed Behavior During Session: [facility] for tasks performed    Balance  Static Standing Balance Static Standing - Balance Support: No upper extremity supported Static Standing - Level of Assistance: 5: Stand by assistance  End  of Session PT - End of Session Equipment Utilized During Treatment: Back brace Activity Tolerance: Patient tolerated treatment well Patient left: in bed;with call bell/phone within reach;with family/visitor present Nurse  Communication: Mobility status   GP     Rada Hay 12/18/2012, 2:16 PM

## 2012-12-18 NOTE — Progress Notes (Signed)
Patient's family requested a CBC to be drawn before patient is discharge home. RN informed family member MD has to order Lab on patient. MD was called and informed of patient's family request. MD called back spoke with RN and requested to speak with family member. Patient's family requested  the number for service excellence.

## 2012-12-18 NOTE — Discharge Summary (Signed)
Physician Discharge Summary  Patient ID: Nancy Gibbs MRN: [id number] DOB/AGE: Nov 17, 1960 52 y.o.  Admit date: 12/16/2012 Discharge date: 12/18/2012  Admission Diagnoses: Lumbar spondylolisthesis and stenosis L4-L5 with radiculopathy  Discharge Diagnoses: Lumbar spondylolisthesis and stenosis L4-L5 with radiculopathy urinary retention Active Problems:   * No active hospital problems. *   Discharged Condition: good  Hospital Course: Patient was admitted to undergo surgical decompression and stabilization of L4-L5. She tolerated procedure well. She's had good relief of leg pain. She is ambulating independently. She did have some transient urinary retention.  Consults: None  Significant Diagnostic Studies: MRI lumbar  Treatments: surgery: Decompression L4-L5 with decompression of L4 and L5 nerve roots posterior lumbar interbody arthrodesis using peek spacers pedicle screw fixation L4-L5 posterior lateral arthrodesis with autograft and allograft.  Discharge Exam: Blood pressure 119/80, pulse 78, temperature 97.9 F (36.6 C), temperature source Oral, resp. rate 18, SpO2 100.00%. Incision is clean and dry motor function is intact in lower extremities including iliopsoas quadriceps tibialis anterior and gastroc  Disposition: 01-Home or Self Care  Discharge Orders   Future Orders Complete By Expires     Call MD for:  redness, tenderness, or signs of infection (pain, swelling, redness, odor or green/yellow discharge around incision site)  As directed     Call MD for:  severe uncontrolled pain  As directed     Call MD for:  temperature >100.4  As directed     Diet - low sodium heart healthy  As directed     Discharge instructions  As directed     Comments:      Okay to shower. Do not apply salves or appointments to incision. No heavy lifting with the upper extremities greater than 15 pounds. May resume driving when not requiring pain medication and patient feels comfortable with doing  so.    Increase activity slowly  As directed         Medication List    TAKE these medications       citalopram 40 MG tablet  Commonly known as:  CELEXA  Take 40 mg by mouth daily.     diazepam 5 MG tablet  Commonly known as:  VALIUM  Take 1 tablet (5 mg total) by mouth every 6 (six) hours as needed for anxiety.     EPIPEN 0.3 mg/0.3 mL Devi  Generic drug:  EPINEPHrine  Inject 0.3 mg into the muscle once.     HYDROcodone-acetaminophen 5-500 MG per tablet  Commonly known as:  VICODIN  Take 1 tablet by mouth every 6 (six) hours as needed for pain. Pain     levothyroxine 50 MCG tablet  Commonly known as:  SYNTHROID, LEVOTHROID  Take 50 mcg by mouth daily.     oxyCODONE-acetaminophen 5-325 MG per tablet  Commonly known as:  PERCOCET/ROXICET  Take 1-2 tablets by mouth every 4 (four) hours as needed for pain.     polyethylene glycol packet  Commonly known as:  MIRALAX / GLYCOLAX  Take 17 g by mouth daily.     propranolol 10 MG tablet  Commonly known as:  INDERAL  Take 10 mg by mouth every other day.     zolpidem 10 MG tablet  Commonly known as:  AMBIEN  Take 10 mg by mouth at bedtime as needed for sleep. sleep         Signed: Stefani Dama 12/18/2012, 9:57 AM

## 2012-12-18 NOTE — Progress Notes (Signed)
Occupational Therapy Treatment Patient Details Name: Nancy Gibbs MRN: [id number] DOB: 1961/09/12 Today's Date: 12/18/2012 Time: 1000-1040 OT Time Calculation (min): 40 min  OT Assessment / Plan / Recommendation Comments on Treatment Session PT progressing well this session and ready from OT standpoint to d/c with son home.     Follow Up Recommendations  Home health OT    Barriers to Discharge       Equipment Recommendations  3 in 1 bedside comode;Other (comment)    Recommendations for Other Services    Frequency Min 2X/week   Plan Discharge plan remains appropriate    Precautions / Restrictions Precautions Precautions: Back;Fall Required Braces or Orthoses: Spinal Brace Spinal Brace: Lumbar corset;Applied in sitting position   Pertinent Vitals/Pain     ADL  Eating/Feeding: Set up Where Assessed - Eating/Feeding: Edge of bed Grooming: Wash/dry hands;Supervision/safety Where Assessed - Grooming: Unsupported standing Lower Body Dressing: Supervision/safety;Other (comment) (AE) Where Assessed - Lower Body Dressing: Supported sit to Pharmacist, hospital: Moderate assistance Toilet Transfer Method: Sit to stand Toilet Transfer Equipment: Raised toilet seat with arms (or 3-in-1 over toilet) Toileting - Clothing Manipulation and Hygiene: Min guard Where Assessed - Toileting Clothing Manipulation and Hygiene: Sit to stand from 3-in-1 or toilet Equipment Used: Gait belt;Other (comment) (hand held (A)) Transfers/Ambulation Related to ADLs: Pt ambulated to rest room with Min (A) hand held ADL Comments: Pt educated on back brace, using AE for LB and transportation for drive to [place] with son. PT is ready for d/c from OT stand point. Pain much more controlled this session    OT Diagnosis:    OT Problem List:   OT Treatment Interventions:     OT Goals Acute Rehab OT Goals OT Goal Formulation: With patient Time For Goal Achievement: 12/31/12 Potential to Achieve Goals:  Good ADL Goals Pt Will Perform Lower Body Bathing: with modified independence;Sit to stand from chair;with adaptive equipment ADL Goal: Lower Body Bathing - Progress: Progressing toward goals Pt Will Perform Lower Body Dressing: with modified independence;Sit to stand from chair;with adaptive equipment ADL Goal: Lower Body Dressing - Progress: Progressing toward goals Pt Will Perform Toileting - Hygiene: with modified independence;Sit to stand from 3-in-1/toilet;with adaptive equipment ADL Goal: Toileting - Hygiene - Progress: Progressing toward goals Miscellaneous OT Goals Miscellaneous OT Goal #1: Pt will complete bed mobility mod i as precursor to adls  Visit Information  Last OT Received On: 12/18/12 Assistance Needed: +1    Subjective Data      Prior Functioning       Cognition  Cognition Overall Cognitive Status: Appears within functional limits for tasks assessed/performed Arousal/Alertness: Awake/alert Orientation Level: Appears intact for tasks assessed Behavior During Session: [facility] for tasks performed    Mobility  Bed Mobility Bed Mobility: Not assessed Transfers Sit to Stand: 4: Min assist;With upper extremity assist;From bed Stand to Sit: 4: Min assist;With upper extremity assist;To chair/3-in-1 Details for Transfer Assistance: incr time     Exercises      Balance     End of Session OT - End of Session Activity Tolerance: Patient tolerated treatment well Patient left: in chair;with call bell/phone within reach Nurse Communication: Mobility status;Precautions  GO Functional Assessment Tool Used: clinical judgement Functional Limitation: Self care   Harrel Carina [facility] 12/18/2012, 11:07 AM Pager: [phone]

## 2012-12-18 NOTE — Progress Notes (Signed)
CARE MANAGEMENT NOTE 12/18/2012  Patient:  Nancy Gibbs, Nancy Gibbs   Account Number:  [id number]  Date Initiated:  12/18/2012  Documentation initiated by:  Vance Peper  Subjective/Objective Assessment:   52 yr old female s/p L4-L5 stabilization and decompression.     Action/Plan:   CM spoke with patient concerning home health and DME needs. Choice offered.Patient will be going to son's home in [place].   Anticipated DC Date:  12/18/2012   Anticipated DC Plan:  HOME W HOME HEALTH SERVICES      DC Planning Services  CM consult      PAC Choice  DURABLE MEDICAL EQUIPMENT  HOME HEALTH   Choice offered to / List presented to:  C-1 Patient   DME arranged  3-N-1      DME agency  [facility]     HH arranged  HH-3 OT  HH-2 PT      [facility] agency  [facility]   Status of service:  Completed, signed off Medicare Important Message given?   (If response is "NO", the following Medicare IM given date fields will be blank) Date Medicare IM given:   Date Additional Medicare IM given:    Discharge Disposition:  HOME W HOME HEALTH SERVICES  Per UR Regulation:    If discussed at Long Length of Stay Meetings, dates discussed:    Comments:  12/18/12 10:50 Vance Peper, RN BSN Case Management  referral called to [facility], [facility] liasion. Patient will be residing at: [address]                                           [place], [place] [address]                                          Cell: [phone]                                          [phone] Neva Seat (daughter)

## 2012-12-19 NOTE — Progress Notes (Signed)
PT Late note for G-codes   12/17/12 1001  PT G-Codes **NOT FOR INPATIENT CLASS**  Functional Assessment Tool Used clinical judgement  Functional Limitation Mobility: Walking and moving around  Mobility: Walking and Moving Around Current Status [phone]) CI  Mobility: Walking and Moving Around Goal Status [phone]) [facility] PT [phone]

## 2013-06-10 ENCOUNTER — Encounter: Payer: Self-pay | Admitting: Family Medicine

## 2013-09-25 ENCOUNTER — Ambulatory Visit (HOSPITAL_COMMUNITY)
Admission: RE | Admit: 2013-09-25 | Discharge: 2013-09-25 | Disposition: A | Discharge status: Home / Self Care | Payer: 59 | Source: Ambulatory Visit | Attending: Family Medicine | Admitting: Family Medicine

## 2013-09-25 ENCOUNTER — Other Ambulatory Visit (HOSPITAL_COMMUNITY): Payer: Self-pay | Admitting: Family Medicine

## 2013-09-25 DIAGNOSIS — M25561 Pain in right knee: Secondary | ICD-10-CM

## 2013-09-25 DIAGNOSIS — M79609 Pain in unspecified limb: Secondary | ICD-10-CM | POA: Insufficient documentation

## 2013-09-25 DIAGNOSIS — M7989 Other specified soft tissue disorders: Secondary | ICD-10-CM

## 2013-09-25 DIAGNOSIS — R599 Enlarged lymph nodes, unspecified: Secondary | ICD-10-CM | POA: Insufficient documentation

## 2013-09-25 NOTE — Progress Notes (Signed)
Right lower extremity venous duplex completed.  Right:  No evidence of DVT, superficial thrombosis, or Baker's cyst.  Left:  Negative for DVT in the common femoral vein.  

## 2013-10-01 ENCOUNTER — Emergency Department (INDEPENDENT_AMBULATORY_CARE_PROVIDER_SITE_OTHER)
Admission: EM | Admit: 2013-10-01 | Discharge: 2013-10-01 | Disposition: A | Discharge status: Home / Self Care | Payer: 59 | Source: Home / Self Care | Attending: Emergency Medicine | Admitting: Emergency Medicine

## 2013-10-01 ENCOUNTER — Encounter (HOSPITAL_COMMUNITY): Payer: Self-pay | Admitting: Emergency Medicine

## 2013-10-01 DIAGNOSIS — M25469 Effusion, unspecified knee: Secondary | ICD-10-CM

## 2013-10-01 DIAGNOSIS — M25461 Effusion, right knee: Secondary | ICD-10-CM

## 2013-10-01 DIAGNOSIS — M25561 Pain in right knee: Secondary | ICD-10-CM

## 2013-10-01 DIAGNOSIS — M25569 Pain in unspecified knee: Secondary | ICD-10-CM

## 2013-10-01 MED ORDER — TRAMADOL HCL 50 MG PO TABS
50.0000 mg | ORAL_TABLET | Freq: Four times a day (QID) | ORAL | Status: DC | PRN
Start: 2013-10-01 — End: 2018-02-11

## 2013-10-01 MED ORDER — NAPROXEN 500 MG PO TABS: 500.0000 mg | ORAL_TABLET | Freq: Two times a day (BID) | ORAL | Status: DC

## 2013-10-01 NOTE — ED Provider Notes (Signed)
CSN: [id number]     Arrival date & time 10/01/13  1805 History   First MD Initiated Contact with Patient 10/01/13 1931     Chief Complaint  Patient presents with  . Knee Pain   (Consider location/radiation/quality/duration/timing/severity/associated sxs/prior Treatment) HPI Comments: Patient presents complaining of 1 wk of intermittent swelling, locking, catching, giving way of her right knee.  She saw her primary care physician when this first began one week ago and was sent to have a venous duplex, had DVT ruled out.  Today, knee gave out and started hurting severely again, and has also started to swell again.  She has constant pain in the knee that is worse with any weightbearing activity. She denies any previous history of knee problems and denies any injury at the onset of this problem one week ago. No fever, chills, NVD, numbness in the leg.   Past Medical History  Diagnosis Date  . Back pain   . Dysrhythmia     tachycardia  . Hypothyroidism   . Headache(784.0)     if meds not taken at specific time  . Anxiety    Past Surgical History  Procedure Laterality Date  . Tonsillectomy    . Back surgery  11/2012  . Abdominal hysterectomy  ?1988   Family History  Problem Relation Age of Onset  . Stroke Mother   . Peripheral vascular disease Mother   . Hyperlipidemia Father   . Cancer Father   . Diabetes Other    History  Substance Use Topics  . Smoking status: Never Smoker   . Smokeless tobacco: Not on file  . Alcohol Use: Yes     Comment: occasional   OB History   Grav Para Term Preterm Abortions TAB SAB Ect Mult Living                 Review of Systems  Constitutional: Negative for fever and chills.  Eyes: Negative for visual disturbance.  Respiratory: Negative for cough and shortness of breath.   Cardiovascular: Negative for chest pain, palpitations and leg swelling.  Gastrointestinal: Negative for nausea, vomiting and abdominal pain.  Endocrine: Negative for  polydipsia and polyuria.  Genitourinary: Negative for dysuria, urgency and frequency.  Musculoskeletal: Positive for arthralgias and joint swelling. Negative for myalgias.  Skin: Negative for rash.  Neurological: Negative for dizziness, weakness and light-headedness.    Allergies  Peanut-containing drug products; Pineapple; and Other  Home Medications   Current Outpatient Rx  Name  Route  Sig  Dispense  Refill  . diazepam (VALIUM) 5 MG tablet   Oral   Take 1 tablet (5 mg total) by mouth every 6 (six) hours as needed for anxiety.   30 tablet   0   . levothyroxine (SYNTHROID, LEVOTHROID) 50 MCG tablet   Oral   Take 50 mcg by mouth daily.         . Nutritional Supplements (EQUATE PO)   Oral   Take by mouth.         . citalopram (CELEXA) 40 MG tablet   Oral   Take 40 mg by mouth daily.         Marland Kitchen EPINEPHrine (EPIPEN) 0.3 mg/0.3 mL DEVI   Intramuscular   Inject 0.3 mg into the muscle once.         Marland Kitchen HYDROcodone-acetaminophen (VICODIN) 5-500 MG per tablet   Oral   Take 1 tablet by mouth every 6 (six) hours as needed for pain. Pain         .  naproxen (NAPROSYN) 500 MG tablet   Oral   Take 1 tablet (500 mg total) by mouth 2 (two) times daily.   60 tablet   0   . oxyCODONE-acetaminophen (PERCOCET/ROXICET) 5-325 MG per tablet   Oral   Take 1-2 tablets by mouth every 4 (four) hours as needed for pain.   60 tablet   0   . polyethylene glycol (MIRALAX / GLYCOLAX) packet   Oral   Take 17 g by mouth daily.         . propranolol (INDERAL) 10 MG tablet   Oral   Take 10 mg by mouth every other day.         . traMADol (ULTRAM) 50 MG tablet   Oral   Take 1 tablet (50 mg total) by mouth every 6 (six) hours as needed.   20 tablet   0   . zolpidem (AMBIEN) 10 MG tablet   Oral   Take 10 mg by mouth at bedtime as needed for sleep. sleep          BP 155/88  Pulse 72  Temp(Src) 98.4 F (36.9 C) (Oral)  Resp 18  SpO2 97% Physical Exam  Nursing note and  vitals reviewed. Constitutional: She is oriented to person, place, and time. Vital signs are normal. She appears well-developed and well-nourished. No distress.  HENT:  Head: Normocephalic and atraumatic.  Pulmonary/Chest: Effort normal. No respiratory distress.  Musculoskeletal:       Right knee: She exhibits decreased range of motion and swelling (mild to moderate). She exhibits no effusion, no ecchymosis, no erythema, no LCL laxity, normal patellar mobility and no MCL laxity. Tenderness found. Medial joint line tenderness noted.  Neurological: She is alert and oriented to person, place, and time. She has normal strength. Coordination normal.  Skin: Skin is warm and dry. No rash noted. She is not diaphoretic.  Psychiatric: She has a normal mood and affect. Judgment normal.    ED Course  Procedures (including critical care time) Labs Review Labs Reviewed - No data to display Imaging Review Dg Knee Complete 4 Views Right  10/05/2013   CLINICAL DATA:  No injury.  Pain with weight-bearing  EXAM: RIGHT KNEE - COMPLETE 4+ VIEW  COMPARISON:  None.  FINDINGS: Minimal medial tibiofemoral joint space and patellofemoral joint space degenerative changes.  Tiny joint effusion.  No fracture or dislocation.  IMPRESSION: Minimal medial tibiofemoral joint space and patellofemoral joint space degenerative changes.  Tiny joint effusion.   Electronically Signed   By: Bridgett Larsson M.D.   On: 10/05/2013 13:18      MDM   1. Knee swelling, right   2. Knee pain, right    Given the history and physical exam findings, this is most likely a meniscal injury.  Start tramadol and naprosyn, refer to ortho.  She has a cane that she will use to take weight off the knee until she sees orthopedics.  RICE    Discharge Medication List as of 10/01/2013  7:54 PM    START taking these medications   Details  naproxen (NAPROSYN) 500 MG tablet Take 1 tablet (500 mg total) by mouth 2 (two) times daily., Starting  10/01/2013, Until Discontinued, Print    traMADol (ULTRAM) 50 MG tablet Take 1 tablet (50 mg total) by mouth every 6 (six) hours as needed., Starting 10/01/2013, Until Discontinued, Print         Graylon Good, PA-C 10/06/13 [phone]

## 2013-10-01 NOTE — ED Notes (Signed)
C/o R knee pain and swelling.  Hx of same 1 week ago.  Saw her PCP and DVT was ruled out. States it swelled up again today.  Tried ice 1 week ago and Motrin without relief. Has kept it elevated at work on a trash can.

## 2013-10-05 ENCOUNTER — Ambulatory Visit (HOSPITAL_COMMUNITY)
Admission: RE | Admit: 2013-10-05 | Discharge: 2013-10-05 | Disposition: A | Discharge status: Home / Self Care | Payer: 59 | Source: Ambulatory Visit | Attending: Family Medicine | Admitting: Family Medicine

## 2013-10-05 ENCOUNTER — Other Ambulatory Visit (HOSPITAL_COMMUNITY): Payer: Self-pay | Admitting: Family Medicine

## 2013-10-05 DIAGNOSIS — R52 Pain, unspecified: Secondary | ICD-10-CM

## 2013-10-05 DIAGNOSIS — M25469 Effusion, unspecified knee: Secondary | ICD-10-CM | POA: Insufficient documentation

## 2013-10-05 DIAGNOSIS — IMO0002 Reserved for concepts with insufficient information to code with codable children: Secondary | ICD-10-CM | POA: Insufficient documentation

## 2013-10-05 DIAGNOSIS — M1710 Unilateral primary osteoarthritis, unspecified knee: Secondary | ICD-10-CM | POA: Insufficient documentation

## 2013-10-06 NOTE — ED Provider Notes (Signed)
Medical screening examination/treatment/procedure(s) were performed by non-physician practitioner and as supervising physician I was immediately available for consultation/collaboration.  Leslee Home, M.D.   Reuben Likes, MD 10/06/13 2132

## 2013-11-12 ENCOUNTER — Other Ambulatory Visit (HOSPITAL_COMMUNITY): Payer: Self-pay | Admitting: Family Medicine

## 2013-11-12 DIAGNOSIS — Z1231 Encounter for screening mammogram for malignant neoplasm of breast: Secondary | ICD-10-CM

## 2013-11-18 ENCOUNTER — Ambulatory Visit (HOSPITAL_COMMUNITY)
Admission: RE | Admit: 2013-11-18 | Discharge: 2013-11-18 | Disposition: A | Discharge status: Home / Self Care | Payer: 59 | Source: Ambulatory Visit | Attending: Family Medicine | Admitting: Family Medicine

## 2013-11-18 DIAGNOSIS — Z1231 Encounter for screening mammogram for malignant neoplasm of breast: Secondary | ICD-10-CM | POA: Insufficient documentation

## 2014-09-01 ENCOUNTER — Other Ambulatory Visit: Payer: Self-pay | Admitting: Family Medicine

## 2014-09-01 ENCOUNTER — Ambulatory Visit
Admission: RE | Admit: 2014-09-01 | Discharge: 2014-09-01 | Disposition: A | Discharge status: Home / Self Care | Payer: 59 | Source: Ambulatory Visit | Attending: Family Medicine | Admitting: Family Medicine

## 2014-09-01 DIAGNOSIS — M79642 Pain in left hand: Secondary | ICD-10-CM

## 2014-09-13 ENCOUNTER — Other Ambulatory Visit (HOSPITAL_COMMUNITY): Payer: Self-pay | Admitting: Gastroenterology

## 2014-09-13 DIAGNOSIS — R110 Nausea: Secondary | ICD-10-CM

## 2014-09-22 ENCOUNTER — Ambulatory Visit (HOSPITAL_COMMUNITY)
Admission: RE | Admit: 2014-09-22 | Discharge: 2014-09-22 | Disposition: A | Discharge status: Home / Self Care | Payer: 59 | Source: Ambulatory Visit | Attending: Gastroenterology | Admitting: Gastroenterology

## 2014-09-22 DIAGNOSIS — R109 Unspecified abdominal pain: Secondary | ICD-10-CM | POA: Insufficient documentation

## 2014-09-22 DIAGNOSIS — N281 Cyst of kidney, acquired: Secondary | ICD-10-CM | POA: Diagnosis not present

## 2014-09-22 DIAGNOSIS — R110 Nausea: Secondary | ICD-10-CM | POA: Diagnosis not present

## 2014-11-05 ENCOUNTER — Other Ambulatory Visit (HOSPITAL_COMMUNITY): Payer: Self-pay | Admitting: Family Medicine

## 2014-11-05 DIAGNOSIS — Z1231 Encounter for screening mammogram for malignant neoplasm of breast: Secondary | ICD-10-CM

## 2014-11-18 ENCOUNTER — Other Ambulatory Visit (HOSPITAL_COMMUNITY): Payer: Self-pay | Admitting: Family Medicine

## 2014-11-18 DIAGNOSIS — M25562 Pain in left knee: Secondary | ICD-10-CM

## 2014-11-18 DIAGNOSIS — M7989 Other specified soft tissue disorders: Secondary | ICD-10-CM

## 2014-11-24 ENCOUNTER — Ambulatory Visit (HOSPITAL_COMMUNITY)
Admission: RE | Admit: 2014-11-24 | Discharge: 2014-11-24 | Disposition: A | Discharge status: Home / Self Care | Payer: 59 | Source: Ambulatory Visit | Attending: Family Medicine | Admitting: Family Medicine

## 2014-11-24 DIAGNOSIS — M7989 Other specified soft tissue disorders: Secondary | ICD-10-CM

## 2014-11-24 DIAGNOSIS — Z1231 Encounter for screening mammogram for malignant neoplasm of breast: Secondary | ICD-10-CM | POA: Insufficient documentation

## 2014-11-24 DIAGNOSIS — M79609 Pain in unspecified limb: Secondary | ICD-10-CM

## 2014-11-24 DIAGNOSIS — M25562 Pain in left knee: Secondary | ICD-10-CM

## 2014-11-24 NOTE — Progress Notes (Signed)
*  PRELIMINARY RESULTS* Vascular Ultrasound Lower extremity venous duplex bilateral has been completed.  Preliminary findings: No evidence of DVT.  Farrel DemarkJill Eunice, RDMS, RVT  11/24/2014, 2:45 PM

## 2015-03-10 ENCOUNTER — Other Ambulatory Visit (HOSPITAL_COMMUNITY): Payer: Self-pay | Admitting: Neurological Surgery

## 2015-03-10 DIAGNOSIS — M4712 Other spondylosis with myelopathy, cervical region: Secondary | ICD-10-CM

## 2015-04-06 ENCOUNTER — Ambulatory Visit (HOSPITAL_COMMUNITY)
Admission: RE | Admit: 2015-04-06 | Discharge: 2015-04-06 | Disposition: A | Discharge status: Home / Self Care | Payer: 59 | Source: Ambulatory Visit | Attending: Neurological Surgery | Admitting: Neurological Surgery

## 2015-04-06 DIAGNOSIS — M5032 Other cervical disc degeneration, mid-cervical region: Secondary | ICD-10-CM | POA: Insufficient documentation

## 2015-04-06 DIAGNOSIS — M4712 Other spondylosis with myelopathy, cervical region: Secondary | ICD-10-CM

## 2015-04-06 DIAGNOSIS — M5021 Other cervical disc displacement,  high cervical region: Secondary | ICD-10-CM | POA: Insufficient documentation

## 2015-11-11 MED FILL — LEVOTHYROXINE 75 MCG TABLET: 75 | 90 days supply | Qty: 90 | Fill #3

## 2015-12-14 DIAGNOSIS — H524 Presbyopia: Secondary | ICD-10-CM | POA: Diagnosis not present

## 2016-01-11 DIAGNOSIS — F419 Anxiety disorder, unspecified: Secondary | ICD-10-CM | POA: Diagnosis not present

## 2016-01-11 DIAGNOSIS — E890 Postprocedural hypothyroidism: Secondary | ICD-10-CM | POA: Diagnosis not present

## 2016-01-11 DIAGNOSIS — B009 Herpesviral infection, unspecified: Secondary | ICD-10-CM | POA: Diagnosis not present

## 2016-01-11 MED FILL — XIIDRA 5% EYE DROPS: 5 | 30 days supply | Qty: 60 | Fill #0

## 2016-01-11 MED FILL — CITALOPRAM HBR 20 MG TABLET: 20 | 30 days supply | Qty: 30 | Fill #0

## 2016-02-01 DIAGNOSIS — Z0000 Encounter for general adult medical examination without abnormal findings: Secondary | ICD-10-CM | POA: Diagnosis not present

## 2016-02-01 DIAGNOSIS — I10 Essential (primary) hypertension: Secondary | ICD-10-CM | POA: Diagnosis not present

## 2016-02-01 DIAGNOSIS — E782 Mixed hyperlipidemia: Secondary | ICD-10-CM | POA: Diagnosis not present

## 2016-02-01 DIAGNOSIS — N898 Other specified noninflammatory disorders of vagina: Secondary | ICD-10-CM | POA: Diagnosis not present

## 2016-02-01 DIAGNOSIS — E890 Postprocedural hypothyroidism: Secondary | ICD-10-CM | POA: Diagnosis not present

## 2016-02-09 MED FILL — LEVOTHYROXINE 75 MCG TABLET: 75 | 90 days supply | Qty: 90 | Fill #0

## 2016-02-09 MED FILL — MYRBETRIQ ER 25 MG TABLET: 25 | 30 days supply | Qty: 30 | Fill #0

## 2016-03-05 MED FILL — CITALOPRAM HBR 20 MG TABLET: 20 | 30 days supply | Qty: 30 | Fill #1

## 2016-03-08 DIAGNOSIS — M549 Dorsalgia, unspecified: Secondary | ICD-10-CM | POA: Diagnosis not present

## 2016-03-08 MED FILL — NAPROXEN 500 MG TABLET: 500 | 10 days supply | Qty: 20 | Fill #0

## 2016-04-16 DIAGNOSIS — S61219A Laceration without foreign body of unspecified finger without damage to nail, initial encounter: Secondary | ICD-10-CM | POA: Diagnosis not present

## 2016-05-08 MED FILL — XIIDRA 5% EYE DROPS: 5 | 30 days supply | Qty: 60 | Fill #1

## 2016-05-08 MED FILL — LEVOTHYROXINE 75 MCG TABLET: 75 | 90 days supply | Qty: 90 | Fill #1

## 2016-05-08 MED FILL — CITALOPRAM HBR 20 MG TABLET: 20 | 30 days supply | Qty: 30 | Fill #2

## 2016-08-15 MED FILL — MYRBETRIQ ER 25 MG TABLET: 25 | 30 days supply | Qty: 30 | Fill #1

## 2016-08-15 MED FILL — LEVOTHYROXINE 75 MCG TABLET: 75 | 90 days supply | Qty: 90 | Fill #2

## 2016-08-15 MED FILL — XIIDRA 5% EYE DROPS: 5 | 30 days supply | Qty: 60 | Fill #2

## 2016-09-26 DIAGNOSIS — Z113 Encounter for screening for infections with a predominantly sexual mode of transmission: Secondary | ICD-10-CM | POA: Diagnosis not present

## 2016-09-26 DIAGNOSIS — K5901 Slow transit constipation: Secondary | ICD-10-CM | POA: Diagnosis not present

## 2016-09-26 DIAGNOSIS — K439 Ventral hernia without obstruction or gangrene: Secondary | ICD-10-CM | POA: Diagnosis not present

## 2016-09-26 DIAGNOSIS — N760 Acute vaginitis: Secondary | ICD-10-CM | POA: Diagnosis not present

## 2016-09-26 MED FILL — metroNIDAZOLE 500 MG TABS: 500 | 7 days supply | Qty: 14 | Fill #0

## 2016-10-09 DIAGNOSIS — K5909 Other constipation: Secondary | ICD-10-CM | POA: Diagnosis not present

## 2016-11-27 DIAGNOSIS — J069 Acute upper respiratory infection, unspecified: Secondary | ICD-10-CM | POA: Diagnosis not present

## 2016-11-27 MED FILL — PROMETHAZINE-CODEINE SYRUP: 6.25-10 | 10 days supply | Qty: 150 | Fill #0

## 2016-11-27 MED FILL — LEVOTHYROXINE 75 MCG TABLET: 75 | 90 days supply | Qty: 90 | Fill #3

## 2016-11-30 DIAGNOSIS — R05 Cough: Secondary | ICD-10-CM | POA: Diagnosis not present

## 2017-03-04 MED FILL — LEVOTHYROXINE 75 MCG TABLET: 75 | 90 days supply | Qty: 90 | Fill #0

## 2017-03-28 DIAGNOSIS — K5900 Constipation, unspecified: Secondary | ICD-10-CM | POA: Diagnosis not present

## 2017-03-28 DIAGNOSIS — R109 Unspecified abdominal pain: Secondary | ICD-10-CM | POA: Diagnosis not present

## 2017-03-29 MED FILL — LEVOTHYROXINE 88 MCG TABLET: 88 | 30 days supply | Qty: 30 | Fill #0

## 2017-05-01 MED FILL — LEVOTHYROXINE 88 MCG TABLET: 88 | 30 days supply | Qty: 30 | Fill #1

## 2017-05-29 DIAGNOSIS — E039 Hypothyroidism, unspecified: Secondary | ICD-10-CM | POA: Diagnosis not present

## 2017-06-01 ENCOUNTER — Ambulatory Visit (INDEPENDENT_AMBULATORY_CARE_PROVIDER_SITE_OTHER): Payer: PRIVATE HEALTH INSURANCE

## 2017-06-01 ENCOUNTER — Encounter (HOSPITAL_COMMUNITY): Payer: Self-pay | Admitting: Family Medicine

## 2017-06-01 ENCOUNTER — Ambulatory Visit (HOSPITAL_COMMUNITY)
Admission: EM | Admit: 2017-06-01 | Discharge: 2017-06-01 | Disposition: A | Discharge status: Home / Self Care | Payer: PRIVATE HEALTH INSURANCE | Attending: Family Medicine | Admitting: Family Medicine

## 2017-06-01 DIAGNOSIS — M25552 Pain in left hip: Secondary | ICD-10-CM | POA: Diagnosis not present

## 2017-06-01 DIAGNOSIS — S134XXA Sprain of ligaments of cervical spine, initial encounter: Secondary | ICD-10-CM

## 2017-06-01 DIAGNOSIS — S7002XA Contusion of left hip, initial encounter: Secondary | ICD-10-CM

## 2017-06-01 MED ORDER — CYCLOBENZAPRINE HCL 5 MG PO TABS: 5.0000 mg | ORAL_TABLET | Freq: Every day | ORAL | 0 refills | Status: DC

## 2017-06-01 MED ORDER — DICLOFENAC SODIUM 75 MG PO TBEC: 75.0000 mg | DELAYED_RELEASE_TABLET | Freq: Two times a day (BID) | ORAL | 0 refills | Status: DC

## 2017-06-01 NOTE — Discharge Instructions (Signed)
X-rays do not show any significant injury and I expect that you will be sore for couple days with gradual improvement to your normal baseline health status over the next 5 days

## 2017-06-01 NOTE — ED Provider Notes (Signed)
[facility]    CSN: [id number] Arrival date & time: 06/01/17  1744     History   Chief Complaint Chief Complaint  Patient presents with  . Fall    HPI Nancy Gibbs is a 56 y.o. female.   Pt here for back pain and shoulder and neck pain after a fall today at work. sts that she fell out of her chair.  Patient works by scanning medical charts. She sits in a chair that has rollers on it. When she leans sideways, the chair this afternoon fell over and she struck her left hip on the arm of the chair and subsequently developed some left neck pain.  Patient is worried that she may have disrupted something in her lumbar spine after having had fusion surgery. Nevertheless, she does not have any pain in her lumbar area. She's having no numbness or weakness in her arms or legs.      Past Medical History:  Diagnosis Date  . Anxiety   . Back pain   . Dysrhythmia    tachycardia  . Headache(784.0)    if meds not taken at specific time  . Hypothyroidism     There are no active problems to display for this patient.   Past Surgical History:  Procedure Laterality Date  . ABDOMINAL HYSTERECTOMY  ?1988  . BACK SURGERY  11/2012  . TONSILLECTOMY      OB History    No data available       Home Medications    Prior to Admission medications   Medication Sig Start Date End Date Taking? Authorizing Provider  citalopram (CELEXA) 40 MG tablet Take 40 mg by mouth daily.    [provider]  cyclobenzaprine (FLEXERIL) 5 MG tablet Take 1 tablet (5 mg total) by mouth at bedtime. 06/01/17   Elvina Sidle, MD  diazepam (VALIUM) 5 MG tablet Take 1 tablet (5 mg total) by mouth every 6 (six) hours as needed for anxiety. 12/18/12   Barnett Abu, MD  diclofenac (VOLTAREN) 75 MG EC tablet Take 1 tablet (75 mg total) by mouth 2 (two) times daily. 06/01/17   Elvina Sidle, MD  EPINEPHrine (EPIPEN) 0.3 mg/0.3 mL DEVI Inject 0.3 mg into the muscle once.    [provider]  HYDROcodone-acetaminophen (VICODIN) 5-500 MG per tablet Take 1 tablet by mouth every 6 (six) hours as needed for pain. Pain    [provider]  levothyroxine (SYNTHROID, LEVOTHROID) 50 MCG tablet Take 50 mcg by mouth daily.    [provider]  naproxen (NAPROSYN) 500 MG tablet Take 1 tablet (500 mg total) by mouth 2 (two) times daily. 10/01/13   Graylon Good, PA-C  Nutritional Supplements (EQUATE PO) Take by mouth.    [provider]  oxyCODONE-acetaminophen (PERCOCET/ROXICET) 5-325 MG per tablet Take 1-2 tablets by mouth every 4 (four) hours as needed for pain. 12/18/12   Barnett Abu, MD  polyethylene glycol (MIRALAX / Ethelene Hal) packet Take 17 g by mouth daily.    [provider]  propranolol (INDERAL) 10 MG tablet Take 10 mg by mouth every other day.    [provider]  traMADol (ULTRAM) 50 MG tablet Take 1 tablet (50 mg total) by mouth every 6 (six) hours as needed. 10/01/13   Baker, Adrian Blackwater, PA-C  zolpidem (AMBIEN) 10 MG tablet Take 10 mg by mouth at bedtime as needed for sleep. sleep    [provider]    Family History Family History  Problem Relation Age of Onset  . Stroke Mother   . Peripheral vascular disease Mother   . Hyperlipidemia Father   . Cancer Father   . Diabetes Other     Social History Social History  Substance Use Topics  . Smoking status: Never Smoker  . Smokeless tobacco: Not on file  . Alcohol use Yes     Comment: occasional     Allergies   Peanut-containing drug products; Pineapple; and Other   Review of Systems Review of Systems  Musculoskeletal: Positive for neck pain.  All other systems reviewed and are negative.    Physical Exam Triage Vital Signs ED Triage Vitals  Enc Vitals Group     BP 06/01/17 1841 136/82     Pulse Rate 06/01/17 1841 80     Resp 06/01/17 1841 18     Temp 06/01/17 1841 97.9 F (36.6 C)     Temp src --      SpO2 06/01/17 1841 100 %      Weight --      Height --      Head Circumference --      Peak Flow --      Pain Score 06/01/17 1838 6     Pain Loc --      Pain Edu? --      Excl. in GC? --    No data found.   Updated Vital Signs BP 136/82   Pulse 80   Temp 97.9 F (36.6 C)   Resp 18   SpO2 100%   Visual Acuity Right Eye Distance:   Left Eye Distance:   Bilateral Distance:    Right Eye Near:   Left Eye Near:    Bilateral Near:     Physical Exam  Constitutional: She is oriented to person, place, and time. She appears well-developed and well-nourished.  HENT:  Right Ear: External ear normal.  Left Ear: External ear normal.  Eyes: Pupils are equal, round, and reactive to light. Conjunctivae and EOM are normal.  Neck: Normal range of motion. Neck supple.  Pulmonary/Chest: Effort normal.  Musculoskeletal: Normal range of motion. She exhibits tenderness.  Patient has some mild tenderness with palpation of the left trochanter and the left paraspinal area adjacent to C7 and T1.  Neurological: She is alert and oriented to person, place, and time.  Skin: Skin is warm and dry.  Nursing note and vitals reviewed.    UC Treatments / Results  Labs (all labs ordered are listed, but only abnormal results are displayed) Labs Reviewed - No data to display  EKG  EKG Interpretation None       Radiology Dg Cervical Spine Complete  Result Date: 06/01/2017 CLINICAL DATA:  Neck pain following a fall from a chair today. EXAM: CERVICAL SPINE - COMPLETE 4+ VIEW COMPARISON:  Cervical spine MR dated 04/06/2015. FINDINGS: Again demonstrated is mild reversal the normal cervical lordosis. Multilevel cervical spine degenerative changes. No prevertebral soft tissue swelling, fractures or subluxations. IMPRESSION: 1. No fracture or subluxation. 2. Multilevel cervical spine degenerative changes with stable mild reversal of the normal cervical lordosis. Electronically Signed   By: Beckie Salts M.D.   On: 06/01/2017 19:46    Dg Hip Unilat With Pelvis 2-3 Views Left  Result Date: 06/01/2017 CLINICAL DATA:  Left hip pain following a fall from a chair today. EXAM: DG HIP (WITH OR WITHOUT PELVIS) 2-3V LEFT COMPARISON:  None. FINDINGS: Normal appearing left hip without fracture or dislocation. Lower lumbar spine  fixation hardware. IMPRESSION: No fracture or dislocation. Electronically Signed   By: Beckie SaltsSteven  Reid M.D.   On: 06/01/2017 19:44    Procedures Procedures (including critical care time)  Medications Ordered in UC Medications - No data to display   Initial Impression / Assessment and Plan / UC Course  I have reviewed the triage vital signs and the nursing notes.  Pertinent labs & imaging results that were available during my care of the patient were reviewed by me and considered in my medical decision making (see chart for details).     Final Clinical Impressions(s) / UC Diagnoses   Final diagnoses:  Contusion of left hip, initial encounter  Sprain of ligaments of cervical spine, initial encounter    New Prescriptions New Prescriptions   CYCLOBENZAPRINE (FLEXERIL) 5 MG TABLET    Take 1 tablet (5 mg total) by mouth at bedtime.   DICLOFENAC (VOLTAREN) 75 MG EC TABLET    Take 1 tablet (75 mg total) by mouth 2 (two) times daily.     Controlled Substance Prescriptions [place] Controlled Substance Registry consulted? Not Applicable   Elvina SidleLauenstein, Sherronda Sweigert, MD 06/01/17 1958

## 2017-06-01 NOTE — ED Triage Notes (Signed)
Pt here for back pain and shoulder and neck pain after a fall today at work. sts that she fell out of her chair.

## 2017-07-24 MED FILL — LEVOTHYROXINE 88 MCG TABLET: 88 | 30 days supply | Qty: 30 | Fill #2

## 2017-08-21 DIAGNOSIS — E039 Hypothyroidism, unspecified: Secondary | ICD-10-CM | POA: Diagnosis not present

## 2017-08-22 DIAGNOSIS — H524 Presbyopia: Secondary | ICD-10-CM | POA: Diagnosis not present

## 2017-09-02 MED FILL — LEVOTHYROXINE 88 MCG TABLET: 88 | 30 days supply | Qty: 30 | Fill #3

## 2017-10-11 MED FILL — LEVOTHYROXINE 88 MCG TABLET: 88 | 30 days supply | Qty: 30 | Fill #4

## 2017-11-13 DIAGNOSIS — M5416 Radiculopathy, lumbar region: Secondary | ICD-10-CM | POA: Diagnosis not present

## 2017-11-13 MED FILL — traMADol HCL 50 MG TABS: 50 | 5 days supply | Qty: 40 | Fill #0

## 2017-11-13 MED FILL — predniSONE 20 MG TABS: 20 | 5 days supply | Qty: 10 | Fill #0

## 2017-11-13 MED FILL — LEVOTHYROXINE 88 MCG TABLET: 88 | 30 days supply | Qty: 30 | Fill #5

## 2017-11-15 ENCOUNTER — Other Ambulatory Visit: Payer: Self-pay | Admitting: Internal Medicine

## 2017-11-16 ENCOUNTER — Other Ambulatory Visit: Payer: Self-pay | Admitting: Family Medicine

## 2017-11-16 DIAGNOSIS — M47816 Spondylosis without myelopathy or radiculopathy, lumbar region: Secondary | ICD-10-CM

## 2017-11-21 ENCOUNTER — Ambulatory Visit
Admission: RE | Admit: 2017-11-21 | Discharge: 2017-11-21 | Disposition: A | Discharge status: Home / Self Care | Payer: 59 | Source: Ambulatory Visit | Attending: Family Medicine | Admitting: Family Medicine

## 2017-11-21 DIAGNOSIS — M47816 Spondylosis without myelopathy or radiculopathy, lumbar region: Secondary | ICD-10-CM

## 2017-11-21 DIAGNOSIS — M48061 Spinal stenosis, lumbar region without neurogenic claudication: Secondary | ICD-10-CM | POA: Diagnosis not present

## 2017-12-13 DIAGNOSIS — M5430 Sciatica, unspecified side: Secondary | ICD-10-CM | POA: Diagnosis not present

## 2017-12-13 MED FILL — traMADol HCL 50 MG TABS: 50 | 8 days supply | Qty: 40 | Fill #0

## 2017-12-19 DIAGNOSIS — Z6835 Body mass index (BMI) 35.0-35.9, adult: Secondary | ICD-10-CM | POA: Diagnosis not present

## 2017-12-19 DIAGNOSIS — M48062 Spinal stenosis, lumbar region with neurogenic claudication: Secondary | ICD-10-CM | POA: Diagnosis not present

## 2017-12-19 DIAGNOSIS — R030 Elevated blood-pressure reading, without diagnosis of hypertension: Secondary | ICD-10-CM | POA: Diagnosis not present

## 2017-12-19 MED FILL — LEVOTHYROXINE 88 MCG TABLET: 88 | 30 days supply | Qty: 30 | Fill #0

## 2017-12-25 ENCOUNTER — Other Ambulatory Visit: Payer: Self-pay | Admitting: Neurological Surgery

## 2018-01-08 ENCOUNTER — Other Ambulatory Visit: Payer: Self-pay | Admitting: *Deleted

## 2018-01-08 NOTE — Patient Outreach (Signed)
[facility] Care Management  01/08/2018  Nancy Gibbs [birth year] [id number]  Subjective: Telephone call to patient's home number, spoke with patient, and HIPAA verified.  D[facility]ed [facility] Transition of care follow up, preoperative call follow up, patient voiced understanding, and is in agreement to both types of follow up.   Patient states she has a lot to do before her surgery on 01/27/18 and is a little overwhelmed.   States she is in the process of moving to a different apartment within the same complex, will be unable to go up and down a lot of step postop, is in the process of contacting surgeon's office to obtain letter of medical necessity, and is also looking into alternative living arrangement for her recovery with friends / family.  Discussed strategies, she has a to do list, is sharing information regarding her needs with providers, asking friends/ family for assistance,  and trying to take things one thing at a time.   Patient voices understanding, is in agreement to incorporate strategies, and utilize other resources as needed.   States this will be her 2nd back surgery, will be in the hospital for 4 -5 days, and is aware that inpatient RNCM will assist with coordination of home health and / or durable medical equipment if needed.  States she has used [facility] in the past for home health services.  Patient states she is able to manage self care and  is coordinating assistance with activities of daily living / home management as needed.  Patient voices understanding of medical diagnosis and treatment plan.  States she is accessing the following [facility] benefits: outpatient pharmacy, short term disability, long term disability, hospital indemnity (not sure if chosen, verbally given contact number for [employer] [phone], will verify benefit, file claim if appropriate), and will follow up with [employer] today to update current family medical leave act (FMLA).   Discussed Advanced Directives, advised of [facility] Employee Spiritual Care (verbally given contact number for Spiritual Care [phone]) Advanced Directives document completion benefit, patient voices understanding, and states she will follow up as needed. Patient states she does not have any preoperative questions, care coordination, disease management, disease monitoring, transportation, community resource, or pharmacy needs at this time.  States she is very appreciative of the follow up and is in agreement to receive [facility] Care Management information post transition of care follow up.    Objective: Per KPN (Knowledge Performance Now, point of care tool) and chart review, patient to be admitted 4/819 for L3[facility].  Patient also has a history of hypothyroidism.     Assessment: Received [facility] Preoperative / Transition of care referral on 01/07/18.   Preoperative call completed, and transition of care follow up pending notification of patient discharge.    Plan: RNCM will call patient for  telephone outreach attempt, transition of care follow up, within 3 business days of hospital discharge notification.         Calandra Madura H. Gardiner Barefootooper RN, BSN, CCM [facility] Care Management [facility] Telephonic CM Phone: [phone] Fax: [phone]

## 2018-01-20 NOTE — Progress Notes (Addendum)
PCP: Shirlean Mylararol Webb, MD  Cardiologist: pt denies  EKG: pt denies past year  Stress test: pt denies   ECHO: pt denies ever  Cardiac Cath: pt denies ever  Chest x-ray: pt denies past year, no respiratory infections/complications

## 2018-01-20 NOTE — Pre-Procedure Instructions (Signed)
Nancy Gibbs  01/20/2018      [facility] - [place], [place] - [address] [facility] [address] [place] [address] Phone: [phone] Fax: [phone]  [facility] [address] [place], [place] - [address] Nancy Gibbs [place] [address] Phone: [phone] Fax: [phone]    Your procedure is scheduled on January 27, 2018.  Report to [facility] Admitting at 1005 AM.  Call this number if you have problems the morning of surgery:  [phone]   Remember:  Do not eat food or drink liquids after midnight.  Take these medicines the morning of surgery with A SIP OF WATER propanolol (inderal)-if needed,  levothyroxine (synthroid), diazepam (valium)-if needed, benadryl -if needed for allergies, eye drops-if needed, tramadol (ultram)-if needed for pain.  7 days prior to surgery STOP taking any Aspirin (unless otherwise instructed by your surgeon), Aleve, Naproxen, Ibuprofen, Motrin, Advil, Goody's, BC's, all herbal medications, fish oil, and all vitamins  Continue all other medications as instructed by your physician except follow the above medication instructions before surgery   Do not wear jewelry, make-up or nail polish.  Do not wear lotions, powders, or perfumes, or deodorant.  Do not shave 48 hours prior to surgery.    Do not bring valuables to the hospital.  [facility] is not responsible for any belongings or valuables.  Contacts, dentures or bridgework may not be worn into surgery.  Leave your suitcase in the car.  After surgery it may be brought to your room.  For patients admitted to the hospital, discharge time will be determined by your treatment team.  Patients discharged the day of surgery will not be allowed to drive home.   Nancy Gibbs- Preparing For Surgery  Before surgery, you can play an important role. Because skin is not sterile, your skin needs to be as free of germs as  possible. You can reduce the number of germs on your skin by washing with CHG (chlorahexidine gluconate) Soap before surgery.  CHG is an antiseptic cleaner which kills germs and bonds with the skin to continue killing germs even after washing.  Please do not use if you have an allergy to CHG or antibacterial soaps. If your skin becomes reddened/irritated stop using the CHG.  Do not shave (including legs and underarms) for at least 48 hours prior to first CHG shower. It is OK to shave your face.  Please follow these instructions carefully.   1. Shower the NIGHT BEFORE SURGERY and the MORNING OF SURGERY with CHG.   2. If you chose to wash your hair, wash your hair first as usual with your normal shampoo.  3. After you shampoo, rinse your hair and body thoroughly to remove the shampoo.  4. Use CHG as you would any other liquid soap. You can apply CHG directly to the skin and wash gently with a scrungie or a clean washcloth.   5. Apply the CHG Soap to your body ONLY FROM THE NECK DOWN.  Do not use on open wounds or open sores. Avoid contact with your eyes, ears, mouth and genitals (private parts). Wash Face and genitals (private parts)  with your normal soap.  6. Wash thoroughly, paying special attention to the area where your surgery will be performed.  7. Thoroughly rinse your body with warm water from the neck down.  8. DO NOT shower/wash with your normal soap after using and rinsing off the CHG Soap.  9.  Pat yourself dry with a CLEAN TOWEL.  10. Wear CLEAN PAJAMAS to bed the night before surgery, wear comfortable clothes the morning of surgery  11. Place CLEAN SHEETS on your bed the night of your first shower and DO NOT SLEEP WITH PETS.  Day of Surgery: Do not apply any deodorants/lotions. Please wear clean clothes to the hospital/surgery center.     Please read over the following fact sheets that you were given. Pain Booklet, Coughing and Deep Breathing, MRSA Information and  Surgical Site Infection Prevention

## 2018-01-21 ENCOUNTER — Other Ambulatory Visit: Payer: Self-pay

## 2018-01-21 ENCOUNTER — Encounter (HOSPITAL_COMMUNITY): Payer: Self-pay

## 2018-01-21 ENCOUNTER — Encounter (HOSPITAL_COMMUNITY)
Admission: RE | Admit: 2018-01-21 | Discharge: 2018-01-21 | Disposition: A | Discharge status: Home / Self Care | Payer: 59 | Source: Ambulatory Visit | Attending: Neurosurgery | Admitting: Neurosurgery

## 2018-01-21 DIAGNOSIS — R030 Elevated blood-pressure reading, without diagnosis of hypertension: Secondary | ICD-10-CM | POA: Diagnosis not present

## 2018-01-21 DIAGNOSIS — Z01812 Encounter for preprocedural laboratory examination: Secondary | ICD-10-CM | POA: Insufficient documentation

## 2018-01-21 DIAGNOSIS — Z01810 Encounter for preprocedural cardiovascular examination: Secondary | ICD-10-CM | POA: Diagnosis not present

## 2018-01-21 DIAGNOSIS — M48062 Spinal stenosis, lumbar region with neurogenic claudication: Secondary | ICD-10-CM | POA: Diagnosis not present

## 2018-01-21 DIAGNOSIS — Z6836 Body mass index (BMI) 36.0-36.9, adult: Secondary | ICD-10-CM | POA: Diagnosis not present

## 2018-01-21 HISTORY — DX: Prediabetes: R73.03

## 2018-01-21 LAB — BASIC METABOLIC PANEL
ANION GAP: 8 (ref 5–15)
BUN: 10 mg/dL (ref 6–20)
CALCIUM: 9.1 mg/dL (ref 8.9–10.3)
CO2: 25 mmol/L (ref 22–32)
CREATININE: 0.78 mg/dL (ref 0.44–1.00)
Chloride: 105 mmol/L (ref 101–111)
Glucose, Bld: 93 mg/dL (ref 65–99)
Potassium: 3.4 mmol/L — ABNORMAL LOW (ref 3.5–5.1)
SODIUM: 138 mmol/L (ref 135–145)

## 2018-01-21 LAB — CBC
HCT: 43.2 % (ref 36.0–46.0)
HEMOGLOBIN: 13.8 g/dL (ref 12.0–15.0)
MCH: 27 pg (ref 26.0–34.0)
MCHC: 31.9 g/dL (ref 30.0–36.0)
MCV: 84.4 fL (ref 78.0–100.0)
PLATELETS: 330 10*3/uL (ref 150–400)
RBC: 5.12 MIL/uL — ABNORMAL HIGH (ref 3.87–5.11)
RDW: 14.6 % (ref 11.5–15.5)
WBC: 11.2 10*3/uL — AB (ref 4.0–10.5)

## 2018-01-21 LAB — SURGICAL PCR SCREEN
MRSA, PCR: NEGATIVE
Staphylococcus aureus: POSITIVE — AB

## 2018-01-21 LAB — TYPE AND SCREEN
ABO/RH(D): O POS
ANTIBODY SCREEN: NEGATIVE

## 2018-01-21 MED ORDER — CHLORHEXIDINE GLUCONATE CLOTH 2 % EX PADS: 6.0000 | MEDICATED_PAD | Freq: Once | CUTANEOUS | Status: DC

## 2018-01-21 NOTE — Progress Notes (Signed)
Mupirocin Ointment Rx called [facility] for positive PCR of Staph. Left message on pt's voicemail informing her of results and need to pick up Rx.

## 2018-01-22 MED FILL — MUPIROCIN 2% OINTMENT: 2 | 5 days supply | Qty: 22 | Fill #0

## 2018-01-22 MED FILL — LEVOTHYROXINE 88 MCG TABLET: 88 | 30 days supply | Qty: 30 | Fill #1

## 2018-01-23 ENCOUNTER — Other Ambulatory Visit: Payer: Self-pay | Admitting: Neurosurgery

## 2018-01-27 ENCOUNTER — Inpatient Hospital Stay (HOSPITAL_COMMUNITY): Payer: 59 | Admitting: Certified Registered Nurse Anesthetist

## 2018-01-27 ENCOUNTER — Inpatient Hospital Stay (HOSPITAL_COMMUNITY): Payer: 59

## 2018-01-27 ENCOUNTER — Inpatient Hospital Stay (HOSPITAL_COMMUNITY)
Admission: RE | Disposition: A | Discharge status: Home / Self Care | Payer: Self-pay | Source: Ambulatory Visit | Attending: Neurosurgery

## 2018-01-27 ENCOUNTER — Inpatient Hospital Stay (HOSPITAL_COMMUNITY): Payer: 59 | Admitting: Emergency Medicine

## 2018-01-27 ENCOUNTER — Other Ambulatory Visit: Payer: Self-pay

## 2018-01-27 ENCOUNTER — Inpatient Hospital Stay (HOSPITAL_COMMUNITY)
Admission: RE | Admit: 2018-01-27 | Discharge: 2018-01-29 | DRG: 455 | Disposition: A | Discharge status: Home / Self Care | Payer: 59 | Source: Ambulatory Visit | Attending: Neurosurgery | Admitting: Neurosurgery

## 2018-01-27 ENCOUNTER — Encounter (HOSPITAL_COMMUNITY): Payer: Self-pay | Admitting: General Practice

## 2018-01-27 DIAGNOSIS — Z833 Family history of diabetes mellitus: Secondary | ICD-10-CM | POA: Diagnosis not present

## 2018-01-27 DIAGNOSIS — F419 Anxiety disorder, unspecified: Secondary | ICD-10-CM | POA: Diagnosis present

## 2018-01-27 DIAGNOSIS — Z79890 Hormone replacement therapy (postmenopausal): Secondary | ICD-10-CM

## 2018-01-27 DIAGNOSIS — M549 Dorsalgia, unspecified: Secondary | ICD-10-CM | POA: Diagnosis present

## 2018-01-27 DIAGNOSIS — R7303 Prediabetes: Secondary | ICD-10-CM | POA: Diagnosis present

## 2018-01-27 DIAGNOSIS — Z79899 Other long term (current) drug therapy: Secondary | ICD-10-CM | POA: Diagnosis not present

## 2018-01-27 DIAGNOSIS — Z885 Allergy status to narcotic agent status: Secondary | ICD-10-CM | POA: Diagnosis not present

## 2018-01-27 DIAGNOSIS — Z91018 Allergy to other foods: Secondary | ICD-10-CM | POA: Diagnosis not present

## 2018-01-27 DIAGNOSIS — Z6836 Body mass index (BMI) 36.0-36.9, adult: Secondary | ICD-10-CM

## 2018-01-27 DIAGNOSIS — Z888 Allergy status to other drugs, medicaments and biological substances status: Secondary | ICD-10-CM | POA: Diagnosis not present

## 2018-01-27 DIAGNOSIS — Z981 Arthrodesis status: Secondary | ICD-10-CM

## 2018-01-27 DIAGNOSIS — M4316 Spondylolisthesis, lumbar region: Secondary | ICD-10-CM | POA: Diagnosis present

## 2018-01-27 DIAGNOSIS — Z90710 Acquired absence of both cervix and uterus: Secondary | ICD-10-CM

## 2018-01-27 DIAGNOSIS — M48062 Spinal stenosis, lumbar region with neurogenic claudication: Secondary | ICD-10-CM | POA: Diagnosis not present

## 2018-01-27 DIAGNOSIS — Z79891 Long term (current) use of opiate analgesic: Secondary | ICD-10-CM | POA: Diagnosis not present

## 2018-01-27 DIAGNOSIS — E039 Hypothyroidism, unspecified: Secondary | ICD-10-CM | POA: Diagnosis present

## 2018-01-27 DIAGNOSIS — M4326 Fusion of spine, lumbar region: Secondary | ICD-10-CM | POA: Diagnosis not present

## 2018-01-27 DIAGNOSIS — Z91010 Allergy to peanuts: Secondary | ICD-10-CM | POA: Diagnosis not present

## 2018-01-27 DIAGNOSIS — Z419 Encounter for procedure for purposes other than remedying health state, unspecified: Secondary | ICD-10-CM

## 2018-01-27 LAB — HEPATIC FUNCTION PANEL
ALT: 25 U/L (ref 14–54)
AST: 32 U/L (ref 15–41)
Albumin: 3.9 g/dL (ref 3.5–5.0)
Alkaline Phosphatase: 77 U/L (ref 38–126)
BILIRUBIN DIRECT: 0.2 mg/dL (ref 0.1–0.5)
BILIRUBIN TOTAL: 0.6 mg/dL (ref 0.3–1.2)
Indirect Bilirubin: 0.4 mg/dL (ref 0.3–0.9)
Total Protein: 8 g/dL (ref 6.5–8.1)

## 2018-01-27 SURGERY — POSTERIOR LUMBAR FUSION 1 LEVEL
Anesthesia: General | Site: Spine Lumbar

## 2018-01-27 MED ORDER — PROPRANOLOL HCL 10 MG PO TABS: 10.0000 mg | ORAL_TABLET | Freq: Every day | ORAL | Status: DC | PRN

## 2018-01-27 MED ORDER — LIDOCAINE 2% (20 MG/ML) 5 ML SYRINGE
INTRAMUSCULAR | Status: AC
  Filled 2018-01-27: qty 5

## 2018-01-27 MED ORDER — FLEET ENEMA 7-19 GM/118ML RE ENEM: 1.0000 | ENEMA | Freq: Once | RECTAL | Status: DC | PRN

## 2018-01-27 MED ORDER — CEFAZOLIN SODIUM-DEXTROSE 1-4 GM/50ML-% IV SOLN
1.0000 g | Freq: Three times a day (TID) | INTRAVENOUS | Status: AC
  Administered 2018-01-27 – 2018-01-28 (×2): 1 g via INTRAVENOUS
  Filled 2018-01-27 (×2): qty 50

## 2018-01-27 MED ORDER — PHENYLEPHRINE 40 MCG/ML (10ML) SYRINGE FOR IV PUSH (FOR BLOOD PRESSURE SUPPORT)
PREFILLED_SYRINGE | INTRAVENOUS | Status: AC
  Filled 2018-01-27: qty 10

## 2018-01-27 MED ORDER — ROCURONIUM BROMIDE 10 MG/ML (PF) SYRINGE
PREFILLED_SYRINGE | INTRAVENOUS | Status: DC | PRN
  Administered 2018-01-27: 60 mg via INTRAVENOUS
  Administered 2018-01-27: 10 mg via INTRAVENOUS
  Administered 2018-01-27: 20 mg via INTRAVENOUS

## 2018-01-27 MED ORDER — DEXAMETHASONE SODIUM PHOSPHATE 10 MG/ML IJ SOLN
INTRAMUSCULAR | Status: AC
  Filled 2018-01-27: qty 1

## 2018-01-27 MED ORDER — ONDANSETRON HCL 4 MG PO TABS: 4.0000 mg | ORAL_TABLET | Freq: Four times a day (QID) | ORAL | Status: DC | PRN

## 2018-01-27 MED ORDER — FENTANYL CITRATE (PF) 250 MCG/5ML IJ SOLN
INTRAMUSCULAR | Status: AC
  Filled 2018-01-27: qty 5

## 2018-01-27 MED ORDER — OXYCODONE HCL 5 MG PO TABS
ORAL_TABLET | ORAL | Status: AC
  Filled 2018-01-27: qty 1

## 2018-01-27 MED ORDER — BISACODYL 10 MG RE SUPP: 10.0000 mg | Freq: Every day | RECTAL | Status: DC | PRN

## 2018-01-27 MED ORDER — SODIUM CHLORIDE 0.9 % IV SOLN: 250.0000 mL | INTRAVENOUS | Status: DC

## 2018-01-27 MED ORDER — OXYCODONE HCL 5 MG PO TABS
5.0000 mg | ORAL_TABLET | Freq: Once | ORAL | Status: AC | PRN
  Administered 2018-01-27: 5 mg via ORAL

## 2018-01-27 MED ORDER — HYDROMORPHONE HCL 1 MG/ML IJ SOLN
1.0000 mg | INTRAMUSCULAR | Status: DC | PRN
  Administered 2018-01-27: 1 mg via INTRAVENOUS
  Filled 2018-01-27: qty 1

## 2018-01-27 MED ORDER — POLYVINYL ALCOHOL 1.4 % OP SOLN: 1.0000 [drp] | Freq: Three times a day (TID) | OPHTHALMIC | Status: DC | PRN

## 2018-01-27 MED ORDER — MIDAZOLAM HCL 2 MG/2ML IJ SOLN
INTRAMUSCULAR | Status: AC
  Filled 2018-01-27: qty 2

## 2018-01-27 MED ORDER — LEVOTHYROXINE SODIUM 88 MCG PO TABS
88.0000 ug | ORAL_TABLET | Freq: Every day | ORAL | Status: DC
  Administered 2018-01-28 – 2018-01-29 (×2): 88 ug via ORAL
  Filled 2018-01-27 (×2): qty 1

## 2018-01-27 MED ORDER — SUGAMMADEX SODIUM 200 MG/2ML IV SOLN
INTRAVENOUS | Status: AC
  Filled 2018-01-27: qty 2

## 2018-01-27 MED ORDER — DIAZEPAM 5 MG PO TABS
5.0000 mg | ORAL_TABLET | Freq: Four times a day (QID) | ORAL | Status: DC | PRN
  Administered 2018-01-27 (×2): 5 mg via ORAL
  Filled 2018-01-27 (×2): qty 1

## 2018-01-27 MED ORDER — LACTATED RINGERS IV SOLN
INTRAVENOUS | Status: DC
  Administered 2018-01-27: 10:00:00 via INTRAVENOUS

## 2018-01-27 MED ORDER — OXYCODONE HCL 5 MG PO TABS
10.0000 mg | ORAL_TABLET | ORAL | Status: DC | PRN
  Administered 2018-01-27 – 2018-01-29 (×10): 10 mg via ORAL
  Filled 2018-01-27 (×10): qty 2

## 2018-01-27 MED ORDER — POLYETHYL GLYCOL-PROPYL GLYCOL 0.4-0.3 % OP SOLN: 1.0000 [drp] | Freq: Three times a day (TID) | OPHTHALMIC | Status: DC | PRN

## 2018-01-27 MED ORDER — FENTANYL CITRATE (PF) 100 MCG/2ML IJ SOLN
INTRAMUSCULAR | Status: AC
  Filled 2018-01-27: qty 2

## 2018-01-27 MED ORDER — PHENOL 1.4 % MT LIQD: 1.0000 | OROMUCOSAL | Status: DC | PRN

## 2018-01-27 MED ORDER — THROMBIN 20000 UNITS EX SOLR
CUTANEOUS | Status: AC
  Filled 2018-01-27: qty 20000

## 2018-01-27 MED ORDER — ACETAMINOPHEN 325 MG PO TABS: 650.0000 mg | ORAL_TABLET | ORAL | Status: DC | PRN

## 2018-01-27 MED ORDER — PHENYLEPHRINE HCL 10 MG/ML IJ SOLN
INTRAVENOUS | Status: DC | PRN
  Administered 2018-01-27: 15 ug/min via INTRAVENOUS

## 2018-01-27 MED ORDER — CEFAZOLIN SODIUM-DEXTROSE 2-4 GM/100ML-% IV SOLN
INTRAVENOUS | Status: AC
  Filled 2018-01-27: qty 100

## 2018-01-27 MED ORDER — VANCOMYCIN HCL 1000 MG IV SOLR
INTRAVENOUS | Status: AC
  Filled 2018-01-27: qty 1000

## 2018-01-27 MED ORDER — MIDAZOLAM HCL 2 MG/2ML IJ SOLN
INTRAMUSCULAR | Status: DC | PRN
  Administered 2018-01-27: 2 mg via INTRAVENOUS

## 2018-01-27 MED ORDER — DEXAMETHASONE SODIUM PHOSPHATE 10 MG/ML IJ SOLN: 10.0000 mg | INTRAMUSCULAR | Status: DC

## 2018-01-27 MED ORDER — LIDOCAINE 2% (20 MG/ML) 5 ML SYRINGE
INTRAMUSCULAR | Status: DC | PRN
  Administered 2018-01-27: 100 mg via INTRAVENOUS

## 2018-01-27 MED ORDER — OXYCODONE HCL 5 MG/5ML PO SOLN: 5.0000 mg | Freq: Once | ORAL | Status: AC | PRN

## 2018-01-27 MED ORDER — PROPOFOL 10 MG/ML IV BOLUS
INTRAVENOUS | Status: DC | PRN
  Administered 2018-01-27: 170 mg via INTRAVENOUS

## 2018-01-27 MED ORDER — ARTIFICIAL TEARS OPHTHALMIC OINT
TOPICAL_OINTMENT | OPHTHALMIC | Status: AC
  Filled 2018-01-27: qty 3.5

## 2018-01-27 MED ORDER — CHLORHEXIDINE GLUCONATE CLOTH 2 % EX PADS: 6.0000 | MEDICATED_PAD | Freq: Once | CUTANEOUS | Status: DC

## 2018-01-27 MED ORDER — THROMBIN (RECOMBINANT) 20000 UNITS EX SOLR
CUTANEOUS | Status: DC | PRN
  Administered 2018-01-27: 20 mL via TOPICAL

## 2018-01-27 MED ORDER — EPINEPHRINE 0.3 MG/0.3ML IJ DEVI: 0.3000 mg | Freq: Every day | INTRAMUSCULAR | Status: DC | PRN

## 2018-01-27 MED ORDER — VANCOMYCIN HCL 1 G IV SOLR
INTRAVENOUS | Status: DC | PRN
  Administered 2018-01-27: 1000 mg

## 2018-01-27 MED ORDER — FENTANYL CITRATE (PF) 250 MCG/5ML IJ SOLN
INTRAMUSCULAR | Status: DC | PRN
  Administered 2018-01-27 (×3): 50 ug via INTRAVENOUS
  Administered 2018-01-27: 100 ug via INTRAVENOUS

## 2018-01-27 MED ORDER — DEXAMETHASONE SODIUM PHOSPHATE 10 MG/ML IJ SOLN
INTRAMUSCULAR | Status: DC | PRN
  Administered 2018-01-27: 10 mg via INTRAVENOUS

## 2018-01-27 MED ORDER — SODIUM CHLORIDE 0.9 % IV SOLN
INTRAVENOUS | Status: DC | PRN
  Administered 2018-01-27: 500 mL

## 2018-01-27 MED ORDER — ONDANSETRON HCL 4 MG/2ML IJ SOLN
INTRAMUSCULAR | Status: AC
  Filled 2018-01-27: qty 2

## 2018-01-27 MED ORDER — SODIUM CHLORIDE 0.9% FLUSH: 3.0000 mL | INTRAVENOUS | Status: DC | PRN

## 2018-01-27 MED ORDER — THROMBIN 5000 UNITS EX SOLR
CUTANEOUS | Status: AC
  Filled 2018-01-27: qty 5000

## 2018-01-27 MED ORDER — MULTIVITAMINS PO CAPS: 1.0000 | ORAL_CAPSULE | Freq: Every day | ORAL | Status: DC

## 2018-01-27 MED ORDER — CEFAZOLIN SODIUM-DEXTROSE 2-4 GM/100ML-% IV SOLN
2.0000 g | INTRAVENOUS | Status: AC
  Administered 2018-01-27: 2 g via INTRAVENOUS

## 2018-01-27 MED ORDER — TRAMADOL HCL 50 MG PO TABS: 50.0000 mg | ORAL_TABLET | Freq: Every day | ORAL | Status: DC | PRN

## 2018-01-27 MED ORDER — FENTANYL CITRATE (PF) 100 MCG/2ML IJ SOLN
25.0000 ug | INTRAMUSCULAR | Status: DC | PRN
  Administered 2018-01-27 (×2): 50 ug via INTRAVENOUS

## 2018-01-27 MED ORDER — DIPHENHYDRAMINE HCL 25 MG PO CAPS
25.0000 mg | ORAL_CAPSULE | Freq: Four times a day (QID) | ORAL | Status: DC | PRN
  Filled 2018-01-27: qty 1

## 2018-01-27 MED ORDER — ONDANSETRON HCL 4 MG/2ML IJ SOLN
INTRAMUSCULAR | Status: DC | PRN
  Administered 2018-01-27: 4 mg via INTRAVENOUS

## 2018-01-27 MED ORDER — SODIUM CHLORIDE 0.9% FLUSH: 3.0000 mL | Freq: Two times a day (BID) | INTRAVENOUS | Status: DC

## 2018-01-27 MED ORDER — ADULT MULTIVITAMIN W/MINERALS CH
1.0000 | ORAL_TABLET | Freq: Every day | ORAL | Status: DC
  Administered 2018-01-27 – 2018-01-28 (×2): 1 via ORAL
  Filled 2018-01-27 (×2): qty 1

## 2018-01-27 MED ORDER — SCOPOLAMINE 1 MG/3DAYS TD PT72
MEDICATED_PATCH | TRANSDERMAL | Status: DC | PRN
  Administered 2018-01-27: 1.5 mg via TRANSDERMAL

## 2018-01-27 MED ORDER — ACETAMINOPHEN 650 MG RE SUPP: 650.0000 mg | RECTAL | Status: DC | PRN

## 2018-01-27 MED ORDER — ONDANSETRON HCL 4 MG/2ML IJ SOLN: 4.0000 mg | Freq: Four times a day (QID) | INTRAMUSCULAR | Status: DC | PRN

## 2018-01-27 MED ORDER — 0.9 % SODIUM CHLORIDE (POUR BTL) OPTIME
TOPICAL | Status: DC | PRN
  Administered 2018-01-27: 1000 mL

## 2018-01-27 MED ORDER — BUPIVACAINE HCL (PF) 0.25 % IJ SOLN
INTRAMUSCULAR | Status: AC
  Filled 2018-01-27: qty 30

## 2018-01-27 MED ORDER — POLYETHYLENE GLYCOL 3350 17 G PO PACK: 17.0000 g | PACK | Freq: Every day | ORAL | Status: DC | PRN

## 2018-01-27 MED ORDER — MENTHOL 3 MG MT LOZG: 1.0000 | LOZENGE | OROMUCOSAL | Status: DC | PRN

## 2018-01-27 MED ORDER — LACTATED RINGERS IV SOLN
INTRAVENOUS | Status: DC | PRN
  Administered 2018-01-27 (×2): via INTRAVENOUS

## 2018-01-27 MED ORDER — ROCURONIUM BROMIDE 10 MG/ML (PF) SYRINGE
PREFILLED_SYRINGE | INTRAVENOUS | Status: AC
  Filled 2018-01-27: qty 5

## 2018-01-27 MED ORDER — BUPIVACAINE HCL (PF) 0.25 % IJ SOLN
INTRAMUSCULAR | Status: DC | PRN
  Administered 2018-01-27: 20 mL

## 2018-01-27 MED ORDER — SUGAMMADEX SODIUM 200 MG/2ML IV SOLN
INTRAVENOUS | Status: DC | PRN
  Administered 2018-01-27: 200 mg via INTRAVENOUS

## 2018-01-27 MED ORDER — PHENYLEPHRINE 40 MCG/ML (10ML) SYRINGE FOR IV PUSH (FOR BLOOD PRESSURE SUPPORT)
PREFILLED_SYRINGE | INTRAVENOUS | Status: DC | PRN
  Administered 2018-01-27 (×2): 80 ug via INTRAVENOUS
  Administered 2018-01-27: 120 ug via INTRAVENOUS
  Administered 2018-01-27: 80 ug via INTRAVENOUS

## 2018-01-27 MED ORDER — EPINEPHRINE PF 1 MG/10ML IJ SOSY
0.3000 mg | PREFILLED_SYRINGE | Freq: Every day | INTRAMUSCULAR | Status: DC | PRN
Start: 2018-01-27 — End: 2018-01-29

## 2018-01-27 SURGICAL SUPPLY — 66 items
BAG DECANTER FOR FLEXI CONT (MISCELLANEOUS) ×2 IMPLANT
BENZOIN TINCTURE PRP APPL 2/3 (GAUZE/BANDAGES/DRESSINGS) ×2 IMPLANT
BLADE CLIPPER SURG (BLADE) IMPLANT
BUR CUTTER 7.0 ROUND (BURR) IMPLANT
BUR MATCHSTICK NEURO 3.0 LAGG (BURR) ×2 IMPLANT
CANISTER SUCT 3000ML PPV (MISCELLANEOUS) ×2 IMPLANT
CAP LCK SPNE (Orthopedic Implant) ×4 IMPLANT
CAP LOCK SPINE RADIUS (Orthopedic Implant) ×4 IMPLANT
CAP LOCKING (Orthopedic Implant) ×4 IMPLANT
CARTRIDGE OIL MAESTRO DRILL (MISCELLANEOUS) ×1 IMPLANT
CATH FOLEY 2WAY SLVR  5CC 14FR (CATHETERS) ×1
CATH FOLEY 2WAY SLVR 5CC 14FR (CATHETERS) ×1 IMPLANT
CLSR STERI-STRIP ANTIMIC 1/2X4 (GAUZE/BANDAGES/DRESSINGS) ×2 IMPLANT
CONT SPEC 4OZ CLIKSEAL STRL BL (MISCELLANEOUS) ×2 IMPLANT
COVER BACK TABLE 60X90IN (DRAPES) ×2 IMPLANT
DECANTER SPIKE VIAL GLASS SM (MISCELLANEOUS) IMPLANT
DERMABOND ADVANCED (GAUZE/BANDAGES/DRESSINGS) ×1
DERMABOND ADVANCED .7 DNX12 (GAUZE/BANDAGES/DRESSINGS) ×1 IMPLANT
DEVICE INTERBODY ELEVATE 23X9 (Cage) ×4 IMPLANT
DIFFUSER DRILL AIR PNEUMATIC (MISCELLANEOUS) ×2 IMPLANT
DRAPE C-ARM 42X72 X-RAY (DRAPES) ×4 IMPLANT
DRAPE HALF SHEET 40X57 (DRAPES) IMPLANT
DRAPE LAPAROTOMY 100X72X124 (DRAPES) ×2 IMPLANT
DRAPE SURG 17X23 STRL (DRAPES) ×8 IMPLANT
DRSG OPSITE POSTOP 4X6 (GAUZE/BANDAGES/DRESSINGS) ×2 IMPLANT
DURAPREP 26ML APPLICATOR (WOUND CARE) ×2 IMPLANT
ELECT REM PT RETURN 9FT ADLT (ELECTROSURGICAL) ×2
ELECTRODE REM PT RTRN 9FT ADLT (ELECTROSURGICAL) ×1 IMPLANT
EVACUATOR 1/8 PVC DRAIN (DRAIN) IMPLANT
GAUZE SPONGE 4X4 12PLY STRL (GAUZE/BANDAGES/DRESSINGS) IMPLANT
GAUZE SPONGE 4X4 16PLY XRAY LF (GAUZE/BANDAGES/DRESSINGS) IMPLANT
GLOVE BIOGEL PI IND STRL 7.0 (GLOVE) ×3 IMPLANT
GLOVE BIOGEL PI IND STRL 7.5 (GLOVE) ×1 IMPLANT
GLOVE BIOGEL PI INDICATOR 7.0 (GLOVE) ×3
GLOVE BIOGEL PI INDICATOR 7.5 (GLOVE) ×1
GLOVE ECLIPSE 9.0 STRL (GLOVE) ×4 IMPLANT
GLOVE EXAM NITRILE LRG STRL (GLOVE) IMPLANT
GLOVE EXAM NITRILE XL STR (GLOVE) IMPLANT
GLOVE EXAM NITRILE XS STR PU (GLOVE) IMPLANT
GLOVE SURG SS PI 7.5 STRL IVOR (GLOVE) ×2 IMPLANT
GOWN STRL REUS W/ TWL LRG LVL3 (GOWN DISPOSABLE) IMPLANT
GOWN STRL REUS W/ TWL XL LVL3 (GOWN DISPOSABLE) ×4 IMPLANT
GOWN STRL REUS W/TWL 2XL LVL3 (GOWN DISPOSABLE) IMPLANT
GOWN STRL REUS W/TWL LRG LVL3 (GOWN DISPOSABLE)
GOWN STRL REUS W/TWL XL LVL3 (GOWN DISPOSABLE) ×4
KIT BASIN OR (CUSTOM PROCEDURE TRAY) ×2 IMPLANT
KIT TURNOVER KIT B (KITS) ×2 IMPLANT
MILL MEDIUM DISP (BLADE) ×2 IMPLANT
NEEDLE HYPO 22GX1.5 SAFETY (NEEDLE) ×2 IMPLANT
NS IRRIG 1000ML POUR BTL (IV SOLUTION) ×2 IMPLANT
OIL CARTRIDGE MAESTRO DRILL (MISCELLANEOUS) ×2
PACK LAMINECTOMY NEURO (CUSTOM PROCEDURE TRAY) ×2 IMPLANT
ROD RADIUS 45MM (Rod) ×2 IMPLANT
ROD SPNL 45X5.5XNS TI RDS (Rod) ×2 IMPLANT
SCREW 5.75X40M (Screw) ×4 IMPLANT
SCREW 6.75X40MM (Screw) ×4 IMPLANT
SPONGE SURGIFOAM ABS GEL 100 (HEMOSTASIS) ×2 IMPLANT
STRIP CLOSURE SKIN 1/2X4 (GAUZE/BANDAGES/DRESSINGS) ×2 IMPLANT
SUT VIC AB 0 CT1 18XCR BRD8 (SUTURE) ×1 IMPLANT
SUT VIC AB 0 CT1 8-18 (SUTURE) ×1
SUT VIC AB 2-0 CT1 18 (SUTURE) ×2 IMPLANT
SUT VIC AB 3-0 SH 8-18 (SUTURE) ×2 IMPLANT
TOWEL GREEN STERILE (TOWEL DISPOSABLE) ×2 IMPLANT
TOWEL GREEN STERILE FF (TOWEL DISPOSABLE) ×2 IMPLANT
TRAY FOLEY W/METER SILVER 16FR (SET/KITS/TRAYS/PACK) ×2 IMPLANT
WATER STERILE IRR 1000ML POUR (IV SOLUTION) ×2 IMPLANT

## 2018-01-27 NOTE — Anesthesia Preprocedure Evaluation (Signed)
Anesthesia Evaluation  Patient identified by MRN, date of birth, ID band Patient awake    Reviewed: Allergy & Precautions, NPO status , Patient's Chart, lab work & pertinent test results, reviewed documented beta blocker date and time   History of Anesthesia Complications Negative for: history of anesthetic complications  Airway Mallampati: II  TM Distance: >3 FB Neck ROM: Full    Dental  (+) Teeth Intact   Pulmonary neg pulmonary ROS,    breath sounds clear to auscultation       Cardiovascular (-) hypertension(-) angina(-) Past MI and (-) CHF + dysrhythmias  Rhythm:Regular Rate:Normal     Neuro/Psych  Headaches, neg Seizures PSYCHIATRIC DISORDERS Anxiety  Neuromuscular disease    GI/Hepatic negative GI ROS, Neg liver ROS,   Endo/Other  Hypothyroidism Morbid obesity  Renal/GU negative Renal ROS     Musculoskeletal negative musculoskeletal ROS (+)   Abdominal   Peds  Hematology negative hematology ROS (+)   Anesthesia Other Findings   Reproductive/Obstetrics                             Anesthesia Physical Anesthesia Plan  ASA: II  Anesthesia Plan: General   Post-op Pain Management:    Induction: Intravenous  PONV Risk Score and Plan: 3 and Ondansetron and Dexamethasone  Airway Management Planned: Oral ETT  Additional Equipment: None  Intra-op Plan:   Post-operative Plan: Extubation in OR  Informed Consent: I have reviewed the patients History and Physical, chart, labs and discussed the procedure including the risks, benefits and alternatives for the proposed anesthesia with the patient or authorized representative who has indicated his/her understanding and acceptance.   Dental advisory given  Plan Discussed with: CRNA and Surgeon  Anesthesia Plan Comments:         Anesthesia Quick Evaluation

## 2018-01-27 NOTE — Op Note (Signed)
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  L3-4 posterior lumbar interbody fusion utilizing interbody cages, locally harvested autograft,  L3-4 posterior lateral arthrodesis utilizing nonsegmental pedicle screw fixation and local autografting.  Removal of L4-5 instrumentation to facilitate L3-4 fusion  Surgeon:Tianna Baus A.Vicci Reder, M.D.  Asst. Surgeon: Wynetta Emery  Anesthesia: General  Indication: 57 year old female who is a patient Dr. Danielle Dess and is status post prior L4-5 decompression and fusion with good results.  She presents with worsening back and bilateral lower extremity symptoms failing conservative management.  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  Fusion appears solid at L4-5.  Remainder of her lumbar spine is recently healthy.  Patient presents now for L3-4 decompression and fusion.  Operative note: After induction of anesthesia, patient position prone on the Wilson frame and properly padded.  Lumbar region prepped and draped sterilely.  Incision made overlying L3-4 and 5.  Dissection performed bilaterally.  Retractor placed.  Prior is mentation identified.  Self-retaining retractor placed.  Pedicle screws patient L4-5 disassembled.  Fusion inspected and found to be solid.  Pedicle screws removed at L4 and L5.  New 6.75 x 40 mm radius bran screws from [employer] medical place bilaterally at L4.  Pedicle screws at L5 left out.  Decompressive laminotomies and foraminotomies then  performed bilaterally at L3-4.  Inferior laminotomy of L3 was performed bilaterally.  Complete facetectomies involving the inferior facets of L3 and the majority of the superior facets of L4 were performed bilaterally.  Ligament flavum and epidural scar elevated and resected.  Foraminotomies completed on the course exiting L3 and L4 nerve root bilaterally.  Epidural venous plexus coagulated and cut.  Bilateral discectomies and performed at L3-4.  Disc space then prepared for interbody fusion.  Distractor placed the patient's right side.  Disc space cleaned of soft tissue on the left side.  9 mm Medtronic expandable cage packed with locally harvested autograft was then packed into place and expanded to its full extent.  Distractor removed patient's right side.  Disc space once again prepared for interbody fusion.  Morselized autograft packed into the interspace.  A second cage packed with autograft was then impacted into place and expanded to its full extent.  Pedicles of L3 were then identified using surface landmarks and intraoperative fluoroscopy.  Superficial bone overlying the pedicle was then removed using high-speed drill.  Each pedicle was then probed using pedicle all each pedicle all track was probed and found to be solidly within the bone.  Each pedicle tract was then tapped with a screw tapped and 5.75 x 40 mm radius bran screws from [employer] medical place bilaterally at L3.  Final images reveal good position of the cages and the hardware at the proper operative level with normal alignment of spine.  Wound is irrigated one final time.  Transverse processes were decorticated.  Morselized autograft packed posterior laterally.  Short segment titanium rods and placed of the screw heads at L3 and L4.  Locking caps placed over the screws.  Locking caps and engaged with a construct under compression.  Gelfoam placed over the laminotomy defects.  Vancomycin powder placed  in the deep wound space.  Wounds and closed  in layers with Vicryl sutures.  Steri-Strips and sterile dressing were applied.  No apparent complications.  Patient tolerated the procedure well and she returns to the recovery room postop.

## 2018-01-27 NOTE — H&P (Signed)
  Nancy Gibbs is an 57 y.o. female.   Chief Complaint: Back and bilateral leg pain HPI: 57 year old female status post prior L4-5 decompression and fusion many years ago with good results presents now with worsening back and bilateral lower extremity symptoms.  Workup demonstrates evidence of adjacent level degeneration with degenerative spondylolisthesis and marked lateral recess stenosis causing severe compression of the thecal sac and L4 nerve roots bilaterally.  Patient is failed conservative management.  She presents now for L3-4 decompression and fusion.  Past Medical History:  Diagnosis Date  . Anxiety   . Back pain   . Dysrhythmia    tachycardia-takes propanolol PRN  . Headache(784.0)    if meds not taken at specific time  . Hypothyroidism   . Pre-diabetes     Past Surgical History:  Procedure Laterality Date  . ABDOMINAL HYSTERECTOMY  ?1988  . BACK SURGERY  11/2012  . TONSILLECTOMY      Family History  Problem Relation Age of Onset  . Stroke Mother   . Peripheral vascular disease Mother   . Hyperlipidemia Father   . Cancer Father   . Diabetes Other    Social History:  reports that she has never smoked. She has never used smokeless tobacco. She reports that she drinks alcohol. She reports that she does not use drugs.  Allergies:  Allergies  Allergen Reactions  . Peanut-Containing Drug Products Anaphylaxis  . Pineapple Anaphylaxis  . Hydrocodone Hives and Itching    Scratching skin until bled.  . Other Rash    Some soaps and detergents, break out skin rashes.  Pt will bring own soap.    No medications prior to admission.    No results found for this or any previous visit (from the past 48 hour(s)). No results found.  Pertinent items noted in HPI and remainder of comprehensive ROS otherwise negative.  There were no vitals taken for this visit.  Is awake and alert.  She is oriented and appropriate.  Speech is fluent.  Judgment insight are intact.   Cranial nerve function normal bilaterally.  Motor examination upper extremities normal.  Motor examination of the lower extremities reveal some mild weakness of quadriceps function bilaterally otherwise motor strength intact.  Sensory examination is nonfocal.  Deep tendon flexes are hypoactive in both lower extremities with absent Achilles reflexes.  No evidence of long track signs.  Gait antalgic.  Posture flexed.  Examination head ears eyes nose throat is unremarkable her chest and abdomen are benign.  Extremities are free from injury deformity. Assessment/Plan L3-4 adjacent level degenerative spondylolisthesis with stenosis and neurogenic claudication.  Plan bilateral L3-4 decompressive laminotomies and foraminotomies followed by posterior lumbar interbody fusion utilizing interbody cages, locally harvested autograft, and augmented with posterior lateral arthrodesis utilizing nonsegmental pedicle screw fixation and local autografting.  This will require exposure the patient's prior L4-5 fusion with removal of hardware.  Risks and benefits of been explained.  Patient wishes to proceed.  Sherilyn CooterHenry A Claxton Levitz 01/27/2018, 9:32 AM

## 2018-01-27 NOTE — Anesthesia Procedure Notes (Addendum)
Procedure Name: Intubation Date/Time: 01/27/2018 10:46 AM Performed by: Valda Favia, CRNA Pre-anesthesia Checklist: Patient identified, Emergency Drugs available, Suction available and Patient being monitored Patient Re-evaluated:Patient Re-evaluated prior to induction Oxygen Delivery Method: Circle System Utilized Preoxygenation: Pre-oxygenation with 100% oxygen Induction Type: IV induction Ventilation: Mask ventilation without difficulty Laryngoscope Size: Mac and 4 Grade View: Grade II Tube type: Oral Tube size: 7.0 mm Number of attempts: 2 Airway Equipment and Method: Stylet Placement Confirmation: ETT inserted through vocal cords under direct vision,  positive ETCO2 and breath sounds checked- equal and bilateral Secured at: 21 cm Tube secured with: Tape Dental Injury: Teeth and Oropharynx as per pre-operative assessment  Comments: DL x1 by Melany Guernsey, SRNA, unable to visualize glottic opening. DL x 2 by A. Labradford Schnitker, CRNA Grade II view

## 2018-01-27 NOTE — Plan of Care (Signed)

## 2018-01-27 NOTE — Progress Notes (Signed)
Orthopedic Tech Progress Note Patient Details:  Nancy Gibbs 05-24-1961 [id number] Patient already has brace. Patient ID: Nancy Gibbs, female   DOB: 05-24-1961, 57 y.o.   MRN: [id number]   Nancy Gibbs, Nancy Gibbs 01/27/2018, 3:16 PM

## 2018-01-27 NOTE — Anesthesia Postprocedure Evaluation (Signed)
Anesthesia Post Note  Patient: Nancy Gibbs  Procedure(s) Performed: Lumbar Three-Four Posterior lumbar interbody fusion (N/A Spine Lumbar)     Patient location during evaluation: PACU Anesthesia Type: General Level of consciousness: awake and alert Pain management: pain level controlled Vital Signs Assessment: post-procedure vital signs reviewed and stable Respiratory status: spontaneous breathing, nonlabored ventilation, respiratory function stable and patient connected to nasal cannula oxygen Cardiovascular status: blood pressure returned to baseline and stable Postop Assessment: no apparent nausea or vomiting Anesthetic complications: no    Last Vitals:  Vitals:   01/27/18 1330 01/27/18 1345  BP: 132/89 136/90  Pulse: 64 76  Resp: 10 15  Temp:    SpO2: 100% 97%    Last Pain:  Vitals:   01/27/18 1353  TempSrc:   PainSc: 4     LLE Motor Response: Purposeful movement;Responds to commands (01/27/18 1345) LLE Sensation: Full sensation (01/27/18 1345) RLE Motor Response: Purposeful movement;Responds to commands (01/27/18 1345) RLE Sensation: Full sensation (01/27/18 1345)      Adolphe Fortunato

## 2018-01-27 NOTE — Brief Op Note (Signed)
01/27/2018  1:03 PM  PATIENT:  Nancy CorriganHelene E Gibbs  57 y.o. female  PRE-OPERATIVE DIAGNOSIS:  Lumbar stenosis with neurogenic claudication  POST-OPERATIVE DIAGNOSIS:  Lumbar stenosis with neurogenic claudication.  PROCEDURE:  Procedure(s): Lumbar Three-Four Posterior lumbar interbody fusion (N/A)  SURGEON:  Surgeon(s) and Role:    * Julio SicksPool, Tannie Koskela, MD - Primary    * Donalee Citrinram, Gary, MD - Assisting  PHYSICIAN ASSISTANT:   ASSISTANTS:    ANESTHESIA:   general  EBL:  300 mL   BLOOD ADMINISTERED:none  DRAINS: none   LOCAL MEDICATIONS USED:  MARCAINE     SPECIMEN:  No Specimen  DISPOSITION OF SPECIMEN:  N/A  COUNTS:  YES  TOURNIQUET:  * No tourniquets in log *  DICTATION: .Dragon Dictation  PLAN OF CARE: Admit to inpatient   PATIENT DISPOSITION:  PACU - hemodynamically stable.   Delay start of Pharmacological VTE agent (>24hrs) due to surgical blood loss or risk of bleeding: yes

## 2018-01-27 NOTE — Transfer of Care (Signed)
Immediate Anesthesia Transfer of Care Note  Patient: Nancy Gibbs  Procedure(s) Performed: Lumbar Three-Four Posterior lumbar interbody fusion (N/A Spine Lumbar)  Patient Location: PACU  Anesthesia Type:General  Level of Consciousness: awake, alert , oriented and patient cooperative  Airway & Oxygen Therapy: Patient Spontanous Breathing and Patient connected to nasal cannula oxygen  Post-op Assessment: Report given to RN, Post -op Vital signs reviewed and stable and Patient moving all extremities X 4  Post vital signs: stable  Last Vitals:  Vitals Value Taken Time  BP 144/98 01/27/2018  1:16 PM  Temp    Pulse 76 01/27/2018  1:19 PM  Resp 17 01/27/2018  1:20 PM  SpO2 100 % 01/27/2018  1:19 PM  Vitals shown include unvalidated device data.  Last Pain:  Vitals:   01/27/18 1009  TempSrc:   PainSc: 6       Patients Stated Pain Goal: 2 (01/27/18 1009)  Complications: No apparent anesthesia complications

## 2018-01-28 MED ORDER — DIPHENHYDRAMINE HCL 25 MG PO CAPS
25.0000 mg | ORAL_CAPSULE | Freq: Four times a day (QID) | ORAL | Status: DC | PRN
  Administered 2018-01-28: 25 mg via ORAL

## 2018-01-28 MED ORDER — DIPHENHYDRAMINE HCL 25 MG PO CAPS: 25.0000 mg | ORAL_CAPSULE | Freq: Four times a day (QID) | ORAL | Status: DC | PRN

## 2018-01-28 MED FILL — Thrombin For Soln 20000 Unit: CUTANEOUS | Qty: 1 | Status: AC

## 2018-01-28 NOTE — Plan of Care (Signed)
  Problem: Activity: Goal: Ability to tolerate increased activity will improve Outcome: Completed/Met   Problem: Education: Goal: Ability to verbalize activity precautions or restrictions will improve Outcome: Completed/Met Goal: Knowledge of the prescribed therapeutic regimen will improve Outcome: Completed/Met   Problem: Pain Management: Goal: Pain level will decrease Outcome: Completed/Met   Problem: Bladder/Genitourinary: Goal: Urinary functional status for postoperative course will improve Outcome: Completed/Met

## 2018-01-28 NOTE — Progress Notes (Signed)
Postop day 1.  Patient with significant incisional pain.  No radicular pain.  Mobilizing slowly.  Afebrile.  Vital signs are stable.  Awake and alert.  Oriented and appropriate.  Motor and sensory function extremities normal.  Wound healing well.  Abdomen soft.  Chest benign.  Progressing reasonably well following L3-4 decompression and fusion.  Continue efforts at mobilization.  Probable discharge home tomorrow.

## 2018-01-28 NOTE — Progress Notes (Signed)
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 States LB ADL have been "difficult for awhile". Pt's parents are staying with her for 3 weeks to assist after DC. Pt moving slowly and requires Min A for LB ADL @ RW level. Began education on use of AE and compensatory techniques  for ADL while adhering to back precautions. Pt did not have brace in room at time of eval. Will plan to see pt again prior to DC to complete ADL education.     Follow Up Recommendations  No OT follow up;Supervision - Intermittent    Equipment Recommendations  3 in 1 bedside commode    Recommendations for Other Services       Precautions / Restrictions Precautions Precautions: Back Precaution Booklet Issued: Yes (comment) Required Braces or Orthoses: Spinal Brace Spinal Brace: Applied in sitting position(brace not in room "locked in her locker") Restrictions Weight Bearing Restrictions: No      Mobility Bed Mobility Overal bed mobility: Needs Assistance Bed Mobility: Rolling;Sidelying to Sit;Sit to Sidelying Rolling: Supervision Sidelying to sit: Min guard     Sit to sidelying: Min guard General bed mobility comments: vc for correct technique; increased time to copmlete  Transfers Overall transfer level: Needs assistance Equipment used: Rolling walker (2 wheeled) Transfers: Sit to/from Stand Sit to Stand: Min guard              Balance Overall balance assessment: Needs assistance   Sitting balance-Leahy Scale: Good       Standing balance-Leahy Scale: Fair                             ADL either performed or assessed with clinical  judgement   ADL Overall ADL's : Needs assistance/impaired     Grooming: Supervision/safety;Set up;Standing   Upper Body Bathing: Set up;Supervision/ safety;Sitting   Lower Body Bathing: Minimal assistance;Sit to/from stand   Upper Body Dressing : Set up;Sitting   Lower Body Dressing: Minimal assistance;Sit to/from stand   Toilet Transfer: Supervision/safety;RW;BSC;Ambulation(BSC over toilet)   Toileting- Clothing Manipulation and Hygiene: Minimal assistance Toileting - Clothing Manipulation Details (indicate cue type and reason): difficulty with adhering to back precautions during hygiene after toileting; issued and educated on use of toilet aid; Difficulty withthis in the past     Functional mobility during ADLs: Min guard;Rolling walker;Cueing for safety(very slow) General ADL Comments: Began education on use of AE for ADL; pt able tocross feet over kneee in supine to doff socks     Vision         Perception     Praxis      Pertinent Vitals/Pain Pain Assessment: 0-10 Pain Score: 5  Pain Location: back Pain Descriptors / Indicators: Discomfort;Sore Pain Intervention(s): Limited activity within patient's tolerance;Repositioned;Ice applied     Hand Dominance Right   Extremity/Trunk Assessment Upper Extremity Assessment Upper Extremity Assessment: Overall WFL for tasks assessed   Lower Extremity Assessment Lower Extremity Assessment: Defer to PT evaluation   Cervical / Trunk Assessment Cervical / Trunk Assessment: Other exceptions(back fusion)   Communication Communication Communication: No difficulties   Cognition Arousal/Alertness: Awake/alert Behavior During Therapy: WFL for tasks assessed/performed Overall Cognitive  Status: Within Functional Limits for tasks assessed                                     General Comments       Exercises     Shoulder Instructions      Home Living Family/patient expects to be discharged to:: Private  residence Living Arrangements: Alone Available Help at Discharge: Family;Available 24 hours/day(parents here for 3 weeks) Type of Home: Apartment Home Access: Stairs to enter Entrance Stairs-Number of Steps: 16 Entrance Stairs-Rails: Right Home Layout: One level     Bathroom Shower/Tub: IT trainerTub/shower unit;Curtain   Bathroom Toilet: Standard Bathroom Accessibility: Yes   Home Equipment: Environmental consultantWalker - standard          Prior Functioning/Environment Level of Independence: Independent        Comments: works as Development worker, international aid" for [employer]; ADL were difficulty Health visitorTAscanning secretary"        OT Problem List: Decreased safety awareness;Decreased knowledge of use of DME or AE;Decreased knowledge of precautions;Pain      OT Treatment/Interventions: Self-care/ADL training;DME and/or AE instruction;Therapeutic activities;Patient/family education    OT Goals(Current goals can be found in the care plan section) Acute Rehab OT Goals Patient Stated Goal: to get better OT Goal Formulation: With patient Time For Goal Achievement: 02/11/18 Potential to Achieve Goals: Good  OT Frequency: Min 2X/week   Barriers to D/C:            Co-evaluation              AM-PAC PT "6 Clicks" Daily Activity     Outcome Measure Help from another person eating meals?: None Help from another person taking care of personal grooming?: None Help from another person toileting, which includes using toliet, bedpan, or urinal?: A Little Help from another person bathing (including washing, rinsing, drying)?: A Little Help from another person to put on and taking off regular upper body clothing?: None Help from another person to put on and taking off regular lower body clothing?: A Little 6 Click Score: 21   End of Session Equipment Utilized During Treatment: Rolling walker;Back brace Nurse Communication: Mobility status;Other (comment)(DC needs)  Activity Tolerance: Patient tolerated treatment well Patient left: in bed;with  call bell/phone within reach  OT Visit Diagnosis: Pain;Muscle weakness (generalized) (M62.81) Pain - part of body: (back)                Time: [id number] OT Time Calculation (min): 25 min Charges:  OT General Charges $OT Visit: 1 Visit OT Evaluation $OT Eval Low Complexity: 1 Low OT Treatments $Self Care/Home Management : 8-22 mins G-Codes:     [facility] Kentuckyil[facility] Haiden Rawlinson, OT/L  [phone] 01/28/2018  Genevie Elman,HILLARY 01/28/2018, 11:04 AM

## 2018-01-28 NOTE — Evaluation (Signed)
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 Pt currently with functional limitations due to the deficits listed below (see PT Problem List). Pt moving very slowly and tends to hold breath with all mobility but this improved throughout session. Ambulated 350' with RW and supervision as well as practicing 4 steps. Will need to practice more before going home as she has 16 to get to apt.  Pt will benefit from skilled PT to increase their independence and safety with mobility to allow discharge to the venue listed below.       Follow Up Recommendations No PT follow up    Equipment Recommendations  Rolling walker with 5" wheels    Recommendations for Other Services       Precautions / Restrictions Precautions Precautions: Back Precaution Booklet Issued: No Precaution Comments: ho given by OT. Pt able to verbalize 3/3 precautions but needed vc's for keeping them with function Required Braces or Orthoses: Spinal Brace Spinal Brace: Applied in sitting position Restrictions Weight Bearing Restrictions: No      Mobility  Bed Mobility Overal bed mobility: Needs Assistance Bed Mobility: Rolling;Sidelying to Sit Rolling: Supervision Sidelying to sit: Min guard     Sit to sidelying: Min guard General bed mobility comments: pt able to get to EOB with increased time but kept precautions. However, as she came to EOB she kept scooting fwd, vc's to maintain safe positioning EOB  Transfers Overall transfer level: Needs assistance Equipment used: Rolling walker (2 wheeled) Transfers: Sit to/from Stand Sit to Stand: Min guard         General transfer comment: pt with  safe hand placement, mild posterior bias, needed hand on her back with initial standing from bed and from toilet  Ambulation/Gait Ambulation/Gait assistance: Supervision Ambulation Distance (Feet): 350 Feet Assistive device: Rolling walker (2 wheeled) Gait Pattern/deviations: Step-through pattern;Decreased stride length;Antalgic Gait velocity: decreased Gait velocity interpretation: <1.8 ft/sec, indicative of risk for recurrent falls General Gait Details: vc's for erect posture and for breathing. Throughout all mobility pt tends to hold breath. Second half of ambulation worked on increasing gait speed and fluidity  Stairs Stairs: Yes Stairs assistance: Min guard Stair Management: One rail Right;Step to pattern;Sideways Number of Stairs: 4 General stair comments: pt felt safer with side stepping rather than foward ascent. Was able to perform with L rail as well as R.   Wheelchair Mobility    Modified Rankin (Stroke Patients Only)       Balance Overall balance assessment: Needs assistance Sitting-balance support: No upper extremity supported;Feet supported Sitting balance-Leahy Scale: Good   Postural control: Posterior lean Standing balance support: No upper extremity supported Standing balance-Leahy Scale: Fair Standing balance comment: occasional LOB bkwds, min A to correct                             Pertinent Vitals/Pain Pain Assessment: Faces Pain Score: 5  Faces Pain Scale: Hurts even more Pain Location: back Pain Descriptors / Indicators: Discomfort;Sore Pain Intervention(s): Limited activity within patient's tolerance;Monitored during session    Home Living Family/patient expects to be discharged to:: Private residence Living Arrangements: Alone Available Help at Discharge: Family;Available 24 hours/day Type of Home: Apartment Home  Access: Stairs to enter Entrance Stairs-Rails: Right Entrance Stairs-Number of Steps: 16 Home Layout: One level Home  Equipment: Walker - standard Additional Comments: parents will be here for 3 weeks    Prior Function Level of Independence: Independent         Comments: 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      Hand Dominance   Dominant Hand: Right    Extremity/Trunk Assessment   Upper Extremity Assessment Upper Extremity Assessment: Overall WFL for tasks assessed    Lower Extremity Assessment Lower Extremity Assessment: Overall WFL for tasks assessed    Cervical / Trunk Assessment Cervical / Trunk Assessment: Normal  Communication   Communication: No difficulties  Cognition Arousal/Alertness: Awake/alert Behavior During Therapy: WFL for tasks assessed/performed Overall Cognitive Status: Within Functional Limits for tasks assessed                                        General Comments General comments (skin integrity, edema, etc.): discussed activity level upon return home.     Exercises     Assessment/Plan    PT Assessment Patient needs continued PT services  PT Problem List Decreased activity tolerance;Decreased balance;Decreased mobility;Decreased knowledge of use of DME;Decreased knowledge of precautions;Pain       PT Treatment Interventions DME instruction;Gait training;Stair training;Functional mobility training;Therapeutic activities;Therapeutic exercise;Balance training;Patient/family education    PT Goals (Current goals can be found in the Care Plan section)  Acute Rehab PT Goals Patient Stated Goal: to get better PT Goal Formulation: With patient Time For Goal Achievement: 02/04/18 Potential to Achieve Goals: Good    Frequency Min 5X/week   Barriers to discharge        Co-evaluation               AM-PAC PT "6 Clicks" Daily Activity  Outcome Measure Difficulty turning over in bed (including adjusting bedclothes, sheets and blankets)?: A Little Difficulty moving from lying on back to  sitting on the side of the bed? : A Little Difficulty sitting down on and standing up from a chair with arms (e.g., wheelchair, bedside commode, etc,.)?: A Little Help needed moving to and from a bed to chair (including a wheelchair)?: A Little Help needed walking in hospital room?: A Little Help needed climbing 3-5 steps with a railing? : A Little 6 Click Score: 18    End of Session Equipment Utilized During Treatment: Back brace Activity Tolerance: Patient tolerated treatment well Patient left: in bed;with call bell/phone within reach;with family/visitor present(sitting EOB) Nurse Communication: Mobility status PT Visit Diagnosis: Pain;Difficulty in walking, not elsewhere classified (R26.2) Pain - part of body: (back)    Time: 1610-9604 PT Time Calculation (min) (ACUTE ONLY): 45 min   Charges:   PT Evaluation $PT Eval Moderate Complexity: 1 Mod PT Treatments $Gait Training: 8-22 mins $Therapeutic Activity: 8-22 mins   PT G Codes:        Lyanne Co, PT  Acute Rehab Services  [phone]   [place] L Zakaria Sedor 01/28/2018, 1:29 PM

## 2018-01-29 MED ORDER — DIAZEPAM 5 MG PO TABS: 5.0000 mg | ORAL_TABLET | Freq: Four times a day (QID) | ORAL | 0 refills | Status: DC | PRN

## 2018-01-29 MED ORDER — OXYCODONE HCL 10 MG PO TABS: 5.0000 mg | ORAL_TABLET | ORAL | 0 refills | Status: DC | PRN

## 2018-01-29 MED FILL — diazePAM 5 MG TABS: 5 | 7 days supply | Qty: 50 | Fill #0

## 2018-01-29 MED FILL — oxyCODONE HCL 10 MG TABS: 10 | 8 days supply | Qty: 60 | Fill #0

## 2018-01-29 MED FILL — Heparin Sodium (Porcine) Inj 1000 Unit/ML: INTRAMUSCULAR | Qty: 30 | Status: AC

## 2018-01-29 MED FILL — Sodium Chloride IV Soln 0.9%: INTRAVENOUS | Qty: 2000 | Status: AC

## 2018-01-29 NOTE — Progress Notes (Signed)
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 Continued to review spinal precautions and continued to provide cues to maintain precautions during functional tasks including stairs this morning. She requires only Min guard for functional transfers, but was able to perform gait and stair training today with general S and rest breaks PRN on stairs. Cues provided on stairs to maintain precautions however. She was left up in her chair with family present and all needs otherwise met this morning.     Follow Up Recommendations  No PT follow up     Equipment Recommendations  Rolling walker with 5" wheels    Recommendations for Other Services       Precautions / Restrictions Precautions Precautions: Back Precaution Booklet Issued: No Precaution Comments: ho given by OT. Pt able to verbalize 3/3 precautions but needed vc's for keeping them with function Required Braces or Orthoses: Spinal Brace Spinal Brace: Applied in sitting position Restrictions Weight Bearing Restrictions: No    Mobility  Bed Mobility               General bed mobility comments: DNT, received up in chair   Transfers Overall transfer level: Needs assistance Equipment used: Rolling walker (2 wheeled) Transfers: Sit to/from Stand Sit to Stand: Min guard         General transfer comment: good safety awareness noted, able to perform with min guard and extended time with RW   Ambulation/Gait Ambulation/Gait assistance: Supervision Ambulation Distance (Feet): 350 Feet Assistive device: Rolling walker (2 wheeled) Gait  Pattern/deviations: Step-through pattern;Decreased stride length;Antalgic Gait velocity: decreased Gait velocity interpretation: <1.8 ft/sec, indicative of risk for recurrent falls General Gait Details: cues for normalized breathing pattern instead of holding breath or hyperventilation   Stairs Stairs: Yes   Stair Management: One rail Right;Step to pattern;Sideways Number of Stairs: 20(10 steps x2 ) General stair comments: continued to work on sideways stair navigation, cues to maintain precautions during functional performance of stairs but with good balance and steadiness overall   Wheelchair Mobility    Modified Rankin (Stroke Patients Only)       Balance Overall balance assessment: Needs assistance Sitting-balance support: No upper extremity supported;Feet supported Sitting balance-Leahy Scale: Good     Standing balance support: No upper extremity supported Standing balance-Leahy Scale: Fair Standing balance comment: mild unsteadiness in standing with no UE support                             Cognition Arousal/Alertness: Awake/alert Behavior During Therapy: WFL for tasks assessed/performed Overall Cognitive Status: Within Functional Limits for tasks assessed                                        Exercises      General Comments        Pertinent Vitals/Pain Pain Assessment: Faces Faces Pain Scale: Hurts little more Pain Location: back Pain Descriptors / Indicators: Discomfort;Sore Pain Intervention(s): Limited activity within patient's tolerance;Monitored during session    Home Living  Prior Function            PT Goals (current goals can now be found in the care plan section) Acute Rehab PT Goals Patient Stated Goal: to get better PT Goal Formulation: With patient Time For Goal Achievement: 02/04/18 Potential to Achieve Goals: Good Progress towards PT goals: Progressing toward goals     Frequency    Min 5X/week      PT Plan Current plan remains appropriate    Co-evaluation              AM-PAC PT "6 Clicks" Daily Activity  Outcome Measure  Difficulty turning over in bed (including adjusting bedclothes, sheets and blankets)?: A Little Difficulty moving from lying on back to sitting on the side of the bed? : A Little Difficulty sitting down on and standing up from a chair with arms (e.g., wheelchair, bedside commode, etc,.)?: A Little Help needed moving to and from a bed to chair (including a wheelchair)?: A Little Help needed walking in hospital room?: A Little Help needed climbing 3-5 steps with a railing? : A Little 6 Click Score: 18    End of Session Equipment Utilized During Treatment: Back brace Activity Tolerance: Patient tolerated treatment well Patient left: in chair;with call bell/phone within reach;with family/visitor present   PT Visit Diagnosis: Pain;Difficulty in walking, not elsewhere classified (R26.2) Pain - part of body: (back )     Time: 4158-3094 PT Time Calculation (min) (ACUTE ONLY): 21 min  Charges:  $Gait Training: 8-22 mins                    G Codes:       Deniece Ree PT, DPT, CBIS  Supplemental Physical Therapist [place]   Pager [phone]

## 2018-01-29 NOTE — Progress Notes (Signed)
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 Pt completing functional mobility within room using RW and overall MinGuard. Completing UB ADLs with setup assist, LB ADLs with MinGuard assist. Reviewed and further educated on AE/compensatory techniques for completing ADLs with pt return demonstrating and verbalizing understanding. Pt reports will have family support after discharge to assist with ADLs PRN. Questions answered throughout. Feel pt is safe to return home from OT standpoint once medically ready. No further acute OT needs identified at this time. Will sign off.   Follow Up Recommendations  No OT follow up;Supervision - Intermittent    Equipment Recommendations  3 in 1 bedside commode          Precautions / Restrictions Precautions Precautions: Back Precaution Booklet Issued: Yes (comment) Precaution Comments: Pt able to verbalize 3/3 precautions  Required Braces or Orthoses: Spinal Brace Spinal Brace: Applied in sitting position;Lumbar corset Restrictions Weight Bearing Restrictions: No       Mobility Bed Mobility Overal bed mobility: Needs Assistance Bed Mobility: Sidelying to Sit;Rolling Rolling: Supervision Sidelying to sit: Supervision       General bed mobility comments: supervision for safety   Transfers Overall transfer level: Needs assistance Equipment used: Rolling walker (2 wheeled) Transfers: Sit to/from Stand Sit to Stand: Min guard         General transfer comment: MinGuard for safety; no physical assist needed     Balance Overall balance assessment:  Needs assistance Sitting-balance support: No upper extremity supported;Feet supported Sitting balance-Leahy Scale: Good     Standing balance support: No upper extremity supported Standing balance-Leahy Scale: Fair Standing balance comment: mild unsteadiness in standing with no UE support                            ADL either performed or assessed with clinical judgement   ADL Overall ADL's : Needs assistance/impaired                 Upper Body Dressing : Set up;Sitting Upper Body Dressing Details (indicate cue type and reason): setup for spinal brace and UB clothing  Lower Body Dressing: Minimal assistance;Sit to/from stand Lower Body Dressing Details (indicate cue type and reason): pt able to complete figure 4 for LB dressing; educated on use of reacher for completing LB dressing as additional compensatory strategy        Toileting - Clothing Manipulation Details (indicate cue type and reason): pt reports increased ease of completing peri-care after toileting with use of toilet aid    Tub/Shower Transfer Details (indicate cue type and reason): educated on uses of 3:1 including use in shower; educated on safe tub transfer techniques Functional mobility during ADLs: Min guard;Rolling walker General ADL Comments: reviewed and further educated on AE/compensatory techniques for completing ADLs with pt return demonstrating and verbalizing understanding; pt appears to have good family support who is also able to assist; pt intermittently requires cues to maintain back precautions during functional task                        Cognition Arousal/Alertness: Awake/alert Behavior During  Therapy: WFL for tasks assessed/performed Overall Cognitive Status: Within Functional Limits for tasks assessed                                                            Pertinent Vitals/ Pain       Pain Assessment: Faces Faces Pain Scale: Hurts little  more Pain Location: back Pain Descriptors / Indicators: Discomfort;Sore Pain Intervention(s): Limited activity within patient's tolerance;Monitored during session                                                          Frequency  Min 2X/week        Progress Toward Goals  OT Goals(current goals can now be found in the care plan section)  Progress towards OT goals: Goals met/education completed, patient discharged from OT  Acute Rehab OT Goals Patient Stated Goal: to get better OT Goal Formulation: With patient Time For Goal Achievement: 02/11/18 Potential to Achieve Goals: Good  Plan Discharge plan remains appropriate;All goals met and education completed, patient discharged from [address] PT "6 Clicks" Daily Activity     Outcome Measure   Help from another person eating meals?: None Help from another person taking care of personal grooming?: None Help from another person toileting, which includes using toliet, bedpan, or urinal?: A Little Help from another person bathing (including washing, rinsing, drying)?: A Little Help from another person to put on and taking off regular upper body clothing?: None Help from another person to put on and taking off regular lower body clothing?: A Little 6 Click Score: 21    End of Session Equipment Utilized During Treatment: Rolling walker;Back brace  OT Visit Diagnosis: Pain;Muscle weakness (generalized) (M62.81) Pain - part of body: (back )   Activity Tolerance Patient tolerated treatment well   Patient Left in chair;with call bell/phone within reach   Nurse Communication Mobility status        Time: 8841-6606 OT Time Calculation (min): 28 min  Charges: OT General Charges $OT Visit: 1 Visit OT Treatments $Self Care/Home Management : 23-37 mins  Lou Cal, OT Pager [phone] 01/29/2018    Raymondo Band 01/29/2018, 12:25 PM

## 2018-01-29 NOTE — Progress Notes (Signed)
Pt doing well. Pt and family given D/C instructions with Rx's, verbal understanding was provided. Pt's incision is covered with Honeycomb dressing and it has no sign of infection. Pt's IV was removed prior to D/C. Pt received RW and 3-n-1 from [facility] per MD order. Pt D/C'd home via wheelchair @ 1150 per MD order. Pt is stable @ D/C and has no other needs at this time. Rema FendtAshley Treasure Ingrum, RN

## 2018-01-29 NOTE — Discharge Summary (Signed)
Physician Discharge Summary  Patient ID: Nancy MuscatHelene E Lagrand MRN: [id number] DOB/AGE: [birth year] 57 y.o.  Admit date: 01/27/2018 Discharge date: 01/29/2018  Admission Diagnoses:  Discharge Diagnoses:  Active Problems:   Lumbar stenosis with neurogenic claudication   Discharged Condition: good  Hospital Course: Patient admitted to the hospital where she underwent an uncomplicated L3-4 decompression and fusion.  Postoperatively doing well.  Ambulating without difficulty.  Ready for discharge home.  Consults:   Significant Diagnostic Studies:   Treatments:   Discharge Exam: Blood pressure 123/72, pulse (!) 101, temperature 99.1 F (37.3 C), temperature source Oral, resp. rate 18, height 5\' 4"  (1.626 m), weight 95.7 kg (211 lb), SpO2 97 %. Awake and alert.  Oriented and appropriate.  Cranial nerve function intact.  Motor and sensory function extremities normal.  Wound healing well.  Chest and abdomen benign.  Disposition: Discharge disposition: 01-Home or Self Care        Allergies as of 01/29/2018      Reactions   Peanut-containing Drug Products Anaphylaxis   Pineapple Anaphylaxis   Hydrocodone Hives, Itching   Scratching skin until bled.   Other Rash   Some soaps and detergents, break out skin rashes.  Pt will bring own soap.      Medication List    TAKE these medications   diazepam 5 MG tablet Commonly known as:  VALIUM Take 1 tablet (5 mg total) by mouth every 6 (six) hours as needed for anxiety. What changed:  Another medication with the same name was added. Make sure you understand how and when to take each.   diazepam 5 MG tablet Commonly known as:  VALIUM Take 1-2 tablets (5-10 mg total) by mouth every 6 (six) hours as needed for muscle spasms. What changed:  You were already taking a medication with the same name, and this prescription was added. Make sure you understand how and when to take each.   diphenhydrAMINE 25 mg capsule Commonly known as:   BENADRYL Take 25-50 mg by mouth every 6 (six) hours as needed for allergies.   EPIPEN 0.3 mg/0.3 mL Devi Generic drug:  EPINEPHrine Inject 0.3 mg into the muscle daily as needed (for allergic reactions.).   levothyroxine 88 MCG tablet Commonly known as:  SYNTHROID, LEVOTHROID Take 88 mcg by mouth daily before breakfast.   LUBRICANT EYE DROPS 0.4-0.3 % Soln Generic drug:  Polyethyl Glycol-Propyl Glycol Place 1-2 drops into both eyes 3 (three) times daily as needed (for dry/irritated eyes.).   MULTIVITAMINS PO Take 1 Scoop by mouth daily. [employer] Powder   Oxycodone HCl 10 MG Tabs Take 0.5-1 tablets (5-10 mg total) by mouth every 3 (three) hours as needed for severe pain ((score 7 to 10)).   polyethylene glycol packet Commonly known as:  MIRALAX / GLYCOLAX Take 17 g by mouth daily as needed (for constipation.).   propranolol 10 MG tablet Commonly known as:  INDERAL Take 10 mg by mouth daily as needed (for heart palpitations.).   traMADol 50 MG tablet Commonly known as:  ULTRAM Take 1 tablet (50 mg total) by mouth every 6 (six) hours as needed. What changed:    when to take this  reasons to take this            Durable Medical Equipment  (From admission, onward)        Start     Ordered   01/27/18 1511  DME Walker rolling  Once    Question:  Patient needs a  walker to treat with the following condition  Answer:  Lumbar stenosis with neurogenic claudication   01/27/18 1510   01/27/18 1511  DME 3 n 1  Once     01/27/18 1510       Signed: Kathaleen Maser Tulip Meharg 01/29/2018, 8:18 AM

## 2018-01-29 NOTE — Discharge Instructions (Signed)

## 2018-01-30 ENCOUNTER — Other Ambulatory Visit: Payer: Self-pay | Admitting: *Deleted

## 2018-01-30 NOTE — Patient Outreach (Signed)
[facility]) Care Management  01/30/2018  Nancy MuscatHelene E Gibbs Nov 29, 1960 [id number]   Subjective: Telephone call to patient's home number, spoke with patient, and HIPAA verified.  [facility]ed [facility] Transition of care follow up, patient voiced understanding, and is in agreement to follow up.  Patient states she is doing okay, following activities restrictions, performing home exercise without difficulty, reviewed discharge instructions, and will call surgeon to schedule follow up visit.   States she will follow up with [facility] regarding weight management coaching / classes and her questions regarding [employer] rewards program.  Patient states she is able to manage self care and has assistance as needed with activities of daily living / home management.   Patient voices understanding of medical diagnosis, surgery, and treatment plan. [facility] benefits discussed on 01/08/18 preoperative call and patient states no additional questions at this time.  Patient states she does not have any education material, transition of care, care coordination, disease management, disease monitoring, transportation, community resource, or pharmacy needs at this time.  States she is very appreciative of the follow up and is in agreement to receive [facility] information.      Objective: Per KPN (Knowledge Performance Now, point of care tool) and chart review, patient to be admitted 4/819 [facility].  Patient also has a history of hypothyroidism.     Assessment: Received [facility] Preoperative / Transition of care referral on 01/07/18.   Preoperative call completed, and Transition of care follow up completed, no care management needs, and will proceed with case closure.       Plan: RNCM will send patient successful outreach letter, [facility] pamph[facility], and magnet. RNCM will complete case closure due to follow up  completed / no care management needs.        Keandrea Tapley H. Gardiner Barefootooper RN, BSN, CCM [facility] Care [facility] Telephonic CM Phone: [phone] Fax: [phone]

## 2018-02-04 DIAGNOSIS — Z6835 Body mass index (BMI) 35.0-35.9, adult: Secondary | ICD-10-CM | POA: Diagnosis not present

## 2018-02-04 DIAGNOSIS — R030 Elevated blood-pressure reading, without diagnosis of hypertension: Secondary | ICD-10-CM | POA: Diagnosis not present

## 2018-02-04 DIAGNOSIS — M48062 Spinal stenosis, lumbar region with neurogenic claudication: Secondary | ICD-10-CM | POA: Diagnosis not present

## 2018-02-08 ENCOUNTER — Emergency Department (HOSPITAL_COMMUNITY)
Admission: EM | Admit: 2018-02-08 | Discharge: 2018-02-08 | Disposition: A | Discharge status: Home / Self Care | Payer: 59 | Attending: Emergency Medicine | Admitting: Emergency Medicine

## 2018-02-08 ENCOUNTER — Encounter (HOSPITAL_COMMUNITY): Payer: Self-pay

## 2018-02-08 ENCOUNTER — Other Ambulatory Visit: Payer: Self-pay

## 2018-02-08 DIAGNOSIS — E039 Hypothyroidism, unspecified: Secondary | ICD-10-CM | POA: Diagnosis not present

## 2018-02-08 DIAGNOSIS — Z79899 Other long term (current) drug therapy: Secondary | ICD-10-CM | POA: Insufficient documentation

## 2018-02-08 DIAGNOSIS — T7840XA Allergy, unspecified, initial encounter: Secondary | ICD-10-CM | POA: Insufficient documentation

## 2018-02-08 DIAGNOSIS — L299 Pruritus, unspecified: Secondary | ICD-10-CM | POA: Diagnosis not present

## 2018-02-08 DIAGNOSIS — Z91010 Allergy to peanuts: Secondary | ICD-10-CM | POA: Diagnosis not present

## 2018-02-08 DIAGNOSIS — T781XXA Other adverse food reactions, not elsewhere classified, initial encounter: Secondary | ICD-10-CM | POA: Diagnosis not present

## 2018-02-08 DIAGNOSIS — R21 Rash and other nonspecific skin eruption: Secondary | ICD-10-CM | POA: Diagnosis not present

## 2018-02-08 MED ORDER — METHYLPREDNISOLONE SODIUM SUCC 125 MG IJ SOLR
125.0000 mg | Freq: Once | INTRAMUSCULAR | Status: AC
  Administered 2018-02-08: 125 mg via INTRAVENOUS
  Filled 2018-02-08: qty 2

## 2018-02-08 MED ORDER — PREDNISONE 20 MG PO TABS: 40.0000 mg | ORAL_TABLET | Freq: Every day | ORAL | 0 refills | Status: DC

## 2018-02-08 MED ORDER — HYDROXYZINE HCL 25 MG PO TABS: 25.0000 mg | ORAL_TABLET | Freq: Four times a day (QID) | ORAL | 0 refills | Status: DC | PRN

## 2018-02-08 MED ORDER — FAMOTIDINE IN NACL 20-0.9 MG/50ML-% IV SOLN
20.0000 mg | Freq: Once | INTRAVENOUS | Status: AC
  Administered 2018-02-08: 20 mg via INTRAVENOUS
  Filled 2018-02-08: qty 50

## 2018-02-08 MED ORDER — DIPHENHYDRAMINE HCL 50 MG/ML IJ SOLN
25.0000 mg | Freq: Once | INTRAMUSCULAR | Status: AC
  Administered 2018-02-08: 25 mg via INTRAVENOUS
  Filled 2018-02-08: qty 1

## 2018-02-08 MED ORDER — EPINEPHRINE 0.3 MG/0.3ML IJ SOAJ: 0.3000 mg | Freq: Once | INTRAMUSCULAR | 0 refills | Status: AC | PRN

## 2018-02-08 MED ORDER — FAMOTIDINE 20 MG PO TABS: 20.0000 mg | ORAL_TABLET | Freq: Two times a day (BID) | ORAL | 0 refills | Status: DC | PRN

## 2018-02-08 NOTE — ED Provider Notes (Signed)
[facility] EMERGENCY DEPARTMENT Provider Note   CSN: [id number] Arrival date & time: 02/08/18  1191     History   Chief Complaint Chief Complaint  Patient presents with  . Allergic Reaction    HPI Nancy Gibbs is a 57 y.o. female.  The history is provided by the patient and medical records. No language interpreter was used.  Allergic Reaction  Presenting symptoms: rash    Nancy Gibbs is a 57 y.o. female  with a PMH of allergies, anxiety, tachycardia who presents to the Emergency Department complaining of allergic reaction. Patient reports that yesterday she developed pruritic hives "all over" including arms, legs, chest and back. She took several doses of Benadryl yesterday with little improvement. Thought about using her EpiPen, but it was expired. She has taken no medications prior to arrival today. No sore throat, facial swelling, cough, wheezing, shortness of breath. No fever, chills. History of similar in the very distant past. She is unsure of trigger. Denies any change in soaps, detergents, lotions, etc. She did have a salad from [employer] and lasagna made by her daughter. She has never had these specific foods before. 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 She reports that if someone cooked with or touched these ingredients, she could have a reaction.   Past Medical History:  Diagnosis Date  . Anxiety   . Back pain   . Dysrhythmia    tachycardia-takes propanolol PRN  . Headache(784.0)    if meds not taken at specific time  . Hypothyroidism   . Pre-diabetes     Patient Active Problem List   Diagnosis Date Noted  . Lumbar stenosis with neurogenic claudication 01/27/2018    Past Surgical History:  Procedure Laterality Date  . ABDOMINAL HYSTERECTOMY  ?1988  . BACK SURGERY  11/2012  . TONSILLECTOMY       OB History   None      Home Medications    Prior to Admission medications   Medication Sig Start Date End Date  Taking? Authorizing Provider  diazepam (VALIUM) 5 MG tablet Take 1 tablet (5 mg total) by mouth every 6 (six) hours as needed for anxiety. Patient not taking: Reported on 01/30/2018 12/18/12   Barnett Abu, MD  diazepam (VALIUM) 5 MG tablet Take 1-2 tablets (5-10 mg total) by mouth every 6 (six) hours as needed for muscle spasms. 01/29/18   Julio Sicks, MD  diphenhydrAMINE (BENADRYL) 25 mg capsule Take 25-50 mg by mouth every 6 (six) hours as needed for allergies.    [provider]  EPINEPHrine (EPIPEN 2-PAK) 0.3 mg/0.3 mL IJ SOAJ injection Inject 0.3 mLs (0.3 mg total) into the muscle once as needed for up to 1 dose (allergic reaction). 02/08/18   Ward, Chase Picket, PA-C  famotidine (PEPCID) 20 MG tablet Take 1 tablet (20 mg total) by mouth 2 (two) times daily as needed (Itching, rash). 02/08/18   Ward, Chase Picket, PA-C  hydrOXYzine (ATARAX/VISTARIL) 25 MG tablet Take 1 tablet (25 mg total) by mouth every 6 (six) hours as needed for itching. 02/08/18   Ward, Chase Picket, PA-C  levothyroxine (SYNTHROID, LEVOTHROID) 88 MCG tablet Take 88 mcg by mouth daily before breakfast. 12/19/17   [provider]  Multiple Vitamin (MULTIVITAMINS PO) Take 1 Scoop by mouth daily. [employer] and Metabolism Powder    [provider]  oxyCODONE 10 MG TABS Take 0.5-1 tablets (5-10 mg total) by mouth every 3 (three) hours as needed for severe  pain ((score 7 to 10)). 01/29/18   Julio Sicks, MD  Polyethyl Glycol-Propyl Glycol (LUBRICANT EYE DROPS) 0.4-0.3 % SOLN Place 1-2 drops into both eyes 3 (three) times daily as needed (for dry/irritated eyes.).    [provider]  polyethylene glycol (MIRALAX / GLYCOLAX) packet Take 17 g by mouth daily as needed (for constipation.).     [provider]  predniSONE (DELTASONE) 20 MG tablet Take 2 tablets (40 mg total) by mouth daily. 02/08/18   Ward, Chase Picket, PA-C  propranolol (INDERAL) 10 MG tablet Take 10 mg by mouth daily as needed  (for heart palpitations.).     [provider]  traMADol (ULTRAM) 50 MG tablet Take 1 tablet (50 mg total) by mouth every 6 (six) hours as needed. Patient taking differently: Take 50 mg by mouth daily as needed (for leg/back pain.).  10/01/13   Graylon Good, PA-C    Family History Family History  Problem Relation Age of Onset  . Stroke Mother   . Peripheral vascular disease Mother   . Hyperlipidemia Father   . Cancer Father   . Diabetes Other     Social History Social History   Tobacco Use  . Smoking status: Never Smoker  . Smokeless tobacco: Never Used  Substance Use Topics  . Alcohol use: Yes    Comment: occasional  . Drug use: No     Allergies   Peanut-containing drug products; Pineapple; Hydrocodone; and Other   Review of Systems Review of Systems  Skin: Positive for color change and rash.  All other systems reviewed and are negative.    Physical Exam Updated Vital Signs BP 119/72   Pulse (!) 102   Temp 97.9 F (36.6 C) (Oral)   Resp (!) 27   Ht 5\' 4"  (1.626 m)   Wt 95.7 kg (211 lb)   SpO2 97%   BMI 36.22 kg/m   Physical Exam  Constitutional: She is oriented to person, place, and time. She appears well-developed and well-nourished. No distress.  Afebrile, non-toxic appearing.  HENT:  Head: Normocephalic and atraumatic.  No oral lesions. Airway patent. OP clear and moist.  Eyes: Conjunctivae are normal. Right eye exhibits no discharge. Left eye exhibits no discharge.  Cardiovascular: Normal rate, regular rhythm and normal heart sounds.  Pulmonary/Chest: Effort normal and breath sounds normal. No respiratory distress. She has no wheezes. She has no rales.  Lungs clear to ausculation bilaterally.  Musculoskeletal: Normal range of motion. She exhibits no tenderness.  Neurological: She is alert and oriented to person, place, and time.  Skin: Skin is warm and dry. Capillary refill takes less than 2 seconds.  Erythematous hives to extremities  and abdomen. No warmth. Not tender to the touch.  No lesions to the palms or soles. No petechiae, purpura, bulla, desquamation or target lesions.  Nursing note and vitals reviewed.    ED Treatments / Results  Labs (all labs ordered are listed, but only abnormal results are displayed) Labs Reviewed - No data to display  EKG None  Radiology No results found.  Procedures Procedures (including critical care time)  Medications Ordered in ED Medications  methylPREDNISolone sodium succinate (SOLU-MEDROL) 125 mg/2 mL injection 125 mg (125 mg Intravenous Given 02/08/18 0909)  famotidine (PEPCID) IVPB 20 mg premix (20 mg Intravenous New Bag/Given 02/08/18 0909)  diphenhydrAMINE (BENADRYL) injection 25 mg (25 mg Intravenous Given 02/08/18 0910)     Initial Impression / Assessment and Plan / ED Course  I have reviewed the  triage vital signs and the nursing notes.  Pertinent labs & imaging results that were available during my care of the patient were reviewed by me and considered in my medical decision making (see chart for details).      Shirley MuscatHelene E Osgood is a 57 y.o. female who presents to ED for pruritic rash c/w allergic reaction to extremities and torso which began yesterday. No known triggers or exposures, but she is highly allergic to peanuts/pineapple. She reports that if someone touches them, then comes into contact with her, she could have reaction.  No evidence of oral swelling or airway compromise. Lungs are clear bilaterally and vitals stable. No respiratory, GI or neurologic symptoms to suggest anaphylaxis. Given Benadryl, pepcid, and steroids in ED. On re-evaluation, symptoms  much improved and patient feels comfortable with discharge to home. Evaluation does not show pathology that would require ongoing emergent intervention or inpatient treatment. Rx for steroid burst given. Also have refilled her EpiPen as hers had expired. Home care instructions and return precautions discussed.  PCP follow up encouraged if symptoms persist. All questions answered.   Final Clinical Impressions(s) / ED Diagnoses   Final diagnoses:  Allergic reaction, initial encounter    ED Discharge Orders        Ordered    hydrOXYzine (ATARAX/VISTARIL) 25 MG tablet  Every 6 hours PRN     02/08/18 0956    famotidine (PEPCID) 20 MG tablet  2 times daily PRN     02/08/18 0956    predniSONE (DELTASONE) 20 MG tablet  Daily     02/08/18 0956    EPINEPHrine (EPIPEN 2-PAK) 0.3 mg/0.3 mL IJ SOAJ injection  Once PRN     02/08/18 0958       Ward, Chase PicketJaime Pilcher, PA-C 02/08/18 1003    Alvira MondaySchlossman, Erin, MD 02/08/18 2329

## 2018-02-08 NOTE — Discharge Instructions (Addendum)
It was my pleasure taking care of you today!   Take Prednisone starting tomorrow morning (This is a daily medication. You had your dose for today in the ER).  Pepcid and hydroxyzine as needed for itching.  I have also given you a refill of your Epipen.   Follow up with your doctor if symptoms are not improving after 3 days. You may return to the emergency department if symptoms worsen, become progressive, or become more concerning.   SEEK IMMEDIATE MEDICAL CARE IF:  You develop difficulty breathing or wheezing, or have a tight feeling in your chest or throat (feeling like your throat is closing) You develop swollen lips or tongue You are unable to swallow fluids or salvia secretions.  New or worsening symptoms develop, any additional concerns.   A severe reaction with any of the above problems should be considered life-threatening. If you suddenly develop difficulty breathing call for local emergency medical help. THIS IS AN EMERGENCY.

## 2018-02-08 NOTE — ED Triage Notes (Signed)
Pt. Reports that she has been having an allergic reaction and taking benadryl since yesterday states she did not use her epi pen because it was expired. Pt airway intact, speaking in full sentences. Pt has large red splotches on wrist, legs, and abdomen. Pt denies taking any benadryl today.

## 2018-02-09 ENCOUNTER — Observation Stay (HOSPITAL_COMMUNITY)
Admission: EM | Admit: 2018-02-09 | Discharge: 2018-02-11 | Disposition: A | Discharge status: Home Health | Payer: 59 | Attending: Internal Medicine | Admitting: Internal Medicine

## 2018-02-09 ENCOUNTER — Emergency Department (HOSPITAL_COMMUNITY): Payer: 59

## 2018-02-09 ENCOUNTER — Encounter (HOSPITAL_COMMUNITY): Payer: Self-pay | Admitting: *Deleted

## 2018-02-09 DIAGNOSIS — Z79890 Hormone replacement therapy (postmenopausal): Secondary | ICD-10-CM | POA: Diagnosis not present

## 2018-02-09 DIAGNOSIS — T783XXA Angioneurotic edema, initial encounter: Principal | ICD-10-CM | POA: Insufficient documentation

## 2018-02-09 DIAGNOSIS — R05 Cough: Secondary | ICD-10-CM | POA: Diagnosis not present

## 2018-02-09 DIAGNOSIS — E039 Hypothyroidism, unspecified: Secondary | ICD-10-CM | POA: Diagnosis not present

## 2018-02-09 DIAGNOSIS — M48062 Spinal stenosis, lumbar region with neurogenic claudication: Secondary | ICD-10-CM | POA: Diagnosis not present

## 2018-02-09 DIAGNOSIS — X58XXXA Exposure to other specified factors, initial encounter: Secondary | ICD-10-CM | POA: Diagnosis not present

## 2018-02-09 HISTORY — DX: Gastro-esophageal reflux disease without esophagitis: K21.9

## 2018-02-09 HISTORY — DX: Anemia, unspecified: D64.9

## 2018-02-09 HISTORY — DX: Personal history of other medical treatment: Z92.89

## 2018-02-09 HISTORY — DX: Panic disorder (episodic paroxysmal anxiety): F41.0

## 2018-02-09 HISTORY — DX: Thyrotoxicosis with diffuse goiter without thyrotoxic crisis or storm: E05.00

## 2018-02-09 HISTORY — DX: Unspecified osteoarthritis, unspecified site: M19.90

## 2018-02-09 HISTORY — DX: Suicide attempt, initial encounter: T14.91XA

## 2018-02-09 HISTORY — DX: Depression, unspecified: F32.A

## 2018-02-09 HISTORY — DX: Major depressive disorder, single episode, unspecified: F32.9

## 2018-02-09 HISTORY — DX: Essential (primary) hypertension: I10

## 2018-02-09 HISTORY — DX: Tachycardia, unspecified: R00.0

## 2018-02-09 MED ORDER — EPINEPHRINE 0.3 MG/0.3ML IJ SOAJ
0.3000 mg | Freq: Once | INTRAMUSCULAR | Status: AC
  Administered 2018-02-09: 0.3 mg via INTRAMUSCULAR
  Filled 2018-02-09: qty 0.3

## 2018-02-09 MED ORDER — DIPHENHYDRAMINE HCL 50 MG/ML IJ SOLN
50.0000 mg | Freq: Once | INTRAMUSCULAR | Status: AC
  Administered 2018-02-09: 50 mg via INTRAVENOUS
  Filled 2018-02-09: qty 1

## 2018-02-09 MED ORDER — ONDANSETRON HCL 4 MG/2ML IJ SOLN
4.0000 mg | Freq: Once | INTRAMUSCULAR | Status: AC
  Administered 2018-02-09: 4 mg via INTRAVENOUS
  Filled 2018-02-09: qty 2

## 2018-02-09 MED ORDER — SODIUM CHLORIDE 0.9 % IV BOLUS
1000.0000 mL | Freq: Once | INTRAVENOUS | Status: AC
  Administered 2018-02-09: 1000 mL via INTRAVENOUS

## 2018-02-09 MED ORDER — METHYLPREDNISOLONE SODIUM SUCC 125 MG IJ SOLR
125.0000 mg | Freq: Once | INTRAMUSCULAR | Status: AC
  Administered 2018-02-09: 125 mg via INTRAVENOUS
  Filled 2018-02-09: qty 2

## 2018-02-09 NOTE — ED Notes (Signed)
Nausea better.  Lips  Less swollen.Nancy Gibbs. Blankets given

## 2018-02-09 NOTE — ED Triage Notes (Signed)
The pt has had an allergic rfeaction since yesterday  She was seen here last pm for the same  She had hives  And since this am she has had a sorethroat lip sl swelling   But she decided to wait and see if the sympatoms go away.   Swollen upper lip no difficulty breathing  No acute distress at present.

## 2018-02-09 NOTE — ED Provider Notes (Signed)
[facility] EMERGENCY DEPARTMENT Provider Note   CSN: [id number] Arrival date & time: 02/09/18  2030     History   Chief Complaint Chief Complaint  Patient presents with  . Allergic Reaction    HPI Nancy Gibbs is a 57 y.o. female.  HPI  57 year old female presents with concern for an allergic reaction with lip swelling.  She states that a couple weeks ago she had lumbar surgery by Dr. Jordan Likes.  Yesterday she developed some hives and went to the ER and was given Solu-Medrol, famotidine, Benadryl and discharged on famotidine, Atarax, and prednisone.  Today she woke up and felt some tightness in her throat and some tongue numbness and swelling.  This is progressed throughout the day.  There have been no new foods or other new inciting factors.  She is allergic to peanuts but has not used her EpiPen in many years.  Now over the last couple hours she has had both lips be swollen and she feels trouble swallowing.  The rash had previously gone away last night and has now recurred but only in her bilateral  anterior and medial thighs.  Past Medical History:  Diagnosis Date  . Anxiety   . Back pain   . Dysrhythmia    tachycardia-takes propanolol PRN  . Headache(784.0)    if meds not taken at specific time  . Hypothyroidism   . Pre-diabetes     Patient Active Problem List   Diagnosis Date Noted  . Lumbar stenosis with neurogenic claudication 01/27/2018    Past Surgical History:  Procedure Laterality Date  . ABDOMINAL HYSTERECTOMY  ?1988  . BACK SURGERY  11/2012  . TONSILLECTOMY       OB History   None      Home Medications    Prior to Admission medications   Medication Sig Start Date End Date Taking? Authorizing Provider  diazepam (VALIUM) 5 MG tablet Take 1-2 tablets (5-10 mg total) by mouth every 6 (six) hours as needed for muscle spasms. 01/29/18  Yes Pool, Sherilyn Cooter, MD  diphenhydrAMINE (BENADRYL) 25 mg capsule Take 25-50 mg by mouth every 6 (six)  hours as needed for allergies.   Yes [provider]  EPINEPHrine (EPIPEN 2-PAK) 0.3 mg/0.3 mL IJ SOAJ injection Inject 0.3 mLs (0.3 mg total) into the muscle once as needed for up to 1 dose (allergic reaction). 02/08/18  Yes Ward, Chase Picket, PA-C  levothyroxine (SYNTHROID, LEVOTHROID) 88 MCG tablet Take 88 mcg by mouth daily before breakfast. 12/19/17  Yes [provider]  Multiple Vitamin (MULTIVITAMINS PO) Take 1 Scoop by mouth daily. [employer] Metabolism Powder   Yes [provider]  oxyCODONE 10 MG TABS Take 0.5-1 tablets (5-10 mg total) by mouth every 3 (three) hours as needed for severe pain ((score 7 to 10)). 01/29/18  Yes Pool, Sherilyn Cooter, MD  polyethylene glycol (MIRALAX / GLYCOLAX) packet Take 17 g by mouth daily as needed (for constipation.).    Yes [provider]  predniSONE (DELTASONE) 20 MG tablet Take 2 tablets (40 mg total) by mouth daily. 02/08/18  Yes Ward, Chase Picket, PA-C  traMADol (ULTRAM) 50 MG tablet Take 1 tablet (50 mg total) by mouth every 6 (six) hours as needed. Patient taking differently: Take 50 mg by mouth daily as needed (for leg/back pain.).  10/01/13  Yes Baker, Zachary H, PA-C  diazepam (VALIUM) 5 MG tablet Take 1 tablet (5 mg total) by mouth every 6 (six) hours as needed for  anxiety. Patient not taking: Reported on 01/30/2018 12/18/12   Barnett AbuElsner, Henry, MD  famotidine (PEPCID) 20 MG tablet Take 1 tablet (20 mg total) by mouth 2 (two) times daily as needed (Itching, rash). 02/08/18   Ward, Chase PicketJaime Pilcher, PA-C  hydrOXYzine (ATARAX/VISTARIL) 25 MG tablet Take 1 tablet (25 mg total) by mouth every 6 (six) hours as needed for itching. 02/08/18   Ward, Chase PicketJaime Pilcher, PA-C    Family History Family History  Problem Relation Age of Onset  . Stroke Mother   . Peripheral vascular disease Mother   . Hyperlipidemia Father   . Cancer Father   . Diabetes Other     Social History Social History   Tobacco Use  . Smoking status: Never  Smoker  . Smokeless tobacco: Never Used  Substance Use Topics  . Alcohol use: Yes    Comment: occasional  . Drug use: No     Allergies   Peanut-containing drug products; Pineapple; Hydrocodone; and Other   Review of Systems Review of Systems  Constitutional: Positive for fever (tmax 100).  HENT: Positive for facial swelling, trouble swallowing and voice change.   Respiratory: Positive for cough, chest tightness and shortness of breath.   Gastrointestinal: Positive for nausea. Negative for vomiting.  Skin: Positive for rash.  All other systems reviewed and are negative.    Physical Exam Updated Vital Signs BP 133/86   Pulse (!) 108   Temp 100.2 F (37.9 C)   Resp (!) 21   Ht 5\' 5"  (1.651 m)   Wt 95.7 kg (211 lb)   SpO2 97%   BMI 35.11 kg/m   Physical Exam  Constitutional: She is oriented to person, place, and time. She appears well-developed and well-nourished. She appears distressed.  HENT:  Head: Normocephalic and atraumatic.  Right Ear: External ear normal.  Left Ear: External ear normal.  Nose: Nose normal.   Both lips symmetrically swollen. Mildly hoarse voice but no stridor. No respiratory distress. No tongue swelling. Speaks in clear sentences.  Eyes: Right eye exhibits no discharge. Left eye exhibits no discharge.  Neck: Neck supple.  Cardiovascular: Normal rate, regular rhythm and normal heart sounds.  Pulmonary/Chest: Effort normal and breath sounds normal. No stridor. She has no wheezes.  Abdominal: Soft. There is no tenderness.  Neurological: She is alert and oriented to person, place, and time.  Skin: Skin is warm and dry. Rash noted. She is not diaphoretic.  Nursing note and vitals reviewed.    ED Treatments / Results  Labs (all labs ordered are listed, but only abnormal results are displayed) Labs Reviewed  CBC WITH DIFFERENTIAL/PLATELET  BASIC METABOLIC PANEL    EKG EKG Interpretation  Date/Time:  Sunday February 09 2018 20:44:14  EDT Ventricular Rate:  115 PR Interval:    QRS Duration: 100 QT Interval:  320 QTC Calculation: 443 R Axis:   57 Text Interpretation:  Sinus tachycardia Abnormal R-wave progression, early transition Borderline T wave abnormalities rate faster compared to January 21 2018 Confirmed by Pricilla LovelessGoldston, Shanyah Gattuso [phone]([id number]) on 02/09/2018 9:48:41 PM   Radiology Dg Chest Portable 1 View  Result Date: 02/09/2018 CLINICAL DATA:  Cough EXAM: PORTABLE CHEST 1 VIEW COMPARISON:  12/09/2012 FINDINGS: Cardiac shadow is within normal limits. Mild vascular congestion is noted with mild bibasilar atelectatic change. No bony abnormality is seen. IMPRESSION: Mild atelectatic changes and vascular congestion. Electronically Signed   By: Alcide CleverMark  Lukens M.D.   On: 02/09/2018 21:04    Procedures .Critical Care Performed by: Pricilla LovelessGoldston, Luretha Eberly,  MD Authorized by: Pricilla Loveless, MD   Critical care provider statement:    Critical care time (minutes):  30   Critical care time was exclusive of:  Separately billable procedures and treating other patients   Critical care was necessary to treat or prevent imminent or life-threatening deterioration of the following conditions:  Respiratory failure   Critical care was time spent personally by me on the following activities:  Development of treatment plan with patient or surrogate, discussions with consultants, evaluation of patient's response to treatment, obtaining history from patient or surrogate, examination of patient, ordering and performing treatments and interventions, ordering and review of laboratory studies, ordering and review of radiographic studies, pulse oximetry, re-evaluation of patient's condition and review of old charts   (including critical care time)  Medications Ordered in ED Medications  sodium chloride 0.9 % bolus 1,000 mL (0 mLs Intravenous Stopped 02/09/18 2146)  ondansetron (ZOFRAN) injection 4 mg (4 mg Intravenous Given 02/09/18 2056)  EPINEPHrine (EPI-PEN)  injection 0.3 mg (0.3 mg Intramuscular Given 02/09/18 2104)  methylPREDNISolone sodium succinate (SOLU-MEDROL) 125 mg/2 mL injection 125 mg (125 mg Intravenous Given 02/09/18 2057)  diphenhydrAMINE (BENADRYL) injection 50 mg (50 mg Intravenous Given 02/09/18 2056)     Initial Impression / Assessment and Plan / ED Course  I have reviewed the triage vital signs and the nursing notes.  Pertinent labs & imaging results that were available during my care of the patient were reviewed by me and considered in my medical decision making (see chart for details).     Given the rash and airway symptoms, she was given treatment for anaphylaxis with epinephrine, Solu-Medrol, Benadryl.  She is feeling somewhat better and I do not think she needs to be on my exam her lips are better but they are definitely still swollen.  She feels like her tongue swelling has gotten better although I never appreciated much tongue swelling.  There is no stridor.  However she still feels a tightness in her throat and given the continued lip swelling I think she will need to be observed.  Given there has been some improvement given another dose of epinephrine and certainly not needing to be intubated.  She had a low-grade temperature with cough which I think is probably atelectasis rather than pneumonia.  She will be observed with hospitalist service.  Dr. Julian Reil to admit.  Final Clinical Impressions(s) / ED Diagnoses   Final diagnoses:  Angioedema of lips, initial encounter    ED Discharge Orders    None       Pricilla Loveless, MD 02/10/18 0009

## 2018-02-09 NOTE — ED Notes (Signed)
More relaxed

## 2018-02-09 NOTE — ED Notes (Signed)
Pt incontinent of urine on chux.  Chux changed.  Linen remains dry.

## 2018-02-10 ENCOUNTER — Encounter (HOSPITAL_COMMUNITY): Payer: Self-pay | Admitting: General Practice

## 2018-02-10 ENCOUNTER — Other Ambulatory Visit: Payer: Self-pay

## 2018-02-10 DIAGNOSIS — T783XXA Angioneurotic edema, initial encounter: Secondary | ICD-10-CM | POA: Diagnosis not present

## 2018-02-10 DIAGNOSIS — Z79890 Hormone replacement therapy (postmenopausal): Secondary | ICD-10-CM | POA: Diagnosis not present

## 2018-02-10 DIAGNOSIS — E039 Hypothyroidism, unspecified: Secondary | ICD-10-CM | POA: Diagnosis not present

## 2018-02-10 DIAGNOSIS — M48062 Spinal stenosis, lumbar region with neurogenic claudication: Secondary | ICD-10-CM

## 2018-02-10 DIAGNOSIS — T783XXD Angioneurotic edema, subsequent encounter: Secondary | ICD-10-CM

## 2018-02-10 LAB — CBC WITH DIFFERENTIAL/PLATELET
BASOS PCT: 0 %
Basophils Absolute: 0 10*3/uL (ref 0.0–0.1)
EOS ABS: 0 10*3/uL (ref 0.0–0.7)
EOS PCT: 0 %
HCT: 35 % — ABNORMAL LOW (ref 36.0–46.0)
Hemoglobin: 11.5 g/dL — ABNORMAL LOW (ref 12.0–15.0)
LYMPHS ABS: 1.1 10*3/uL (ref 0.7–4.0)
Lymphocytes Relative: 5 %
MCH: 27.4 pg (ref 26.0–34.0)
MCHC: 32.9 g/dL (ref 30.0–36.0)
MCV: 83.3 fL (ref 78.0–100.0)
Monocytes Absolute: 0.3 10*3/uL (ref 0.1–1.0)
Monocytes Relative: 1 %
NEUTROS PCT: 94 %
Neutro Abs: 19.7 10*3/uL — ABNORMAL HIGH (ref 1.7–7.7)
PLATELETS: 470 10*3/uL — AB (ref 150–400)
RBC: 4.2 MIL/uL (ref 3.87–5.11)
RDW: 14.4 % (ref 11.5–15.5)
WBC: 21 10*3/uL — AB (ref 4.0–10.5)

## 2018-02-10 LAB — BASIC METABOLIC PANEL
Anion gap: 7 (ref 5–15)
BUN: 14 mg/dL (ref 6–20)
CO2: 22 mmol/L (ref 22–32)
CREATININE: 0.92 mg/dL (ref 0.44–1.00)
Calcium: 8.2 mg/dL — ABNORMAL LOW (ref 8.9–10.3)
Chloride: 109 mmol/L (ref 101–111)
Glucose, Bld: 167 mg/dL — ABNORMAL HIGH (ref 65–99)
POTASSIUM: 3.7 mmol/L (ref 3.5–5.1)
SODIUM: 138 mmol/L (ref 135–145)

## 2018-02-10 LAB — HIV ANTIBODY (ROUTINE TESTING W REFLEX): HIV SCREEN 4TH GENERATION: NONREACTIVE

## 2018-02-10 MED ORDER — DIPHENHYDRAMINE HCL 25 MG PO CAPS
25.0000 mg | ORAL_CAPSULE | Freq: Four times a day (QID) | ORAL | Status: DC | PRN
  Administered 2018-02-10 – 2018-02-11 (×2): 50 mg via ORAL
  Filled 2018-02-10 (×2): qty 2

## 2018-02-10 MED ORDER — PREDNISONE 20 MG PO TABS
40.0000 mg | ORAL_TABLET | Freq: Every day | ORAL | Status: DC
  Administered 2018-02-11: 40 mg via ORAL
  Filled 2018-02-10: qty 2

## 2018-02-10 MED ORDER — POLYETHYLENE GLYCOL 3350 17 G PO PACK
17.0000 g | PACK | Freq: Every day | ORAL | Status: DC | PRN
  Administered 2018-02-10: 17 g via ORAL
  Filled 2018-02-10: qty 1

## 2018-02-10 MED ORDER — METHYLPREDNISOLONE SODIUM SUCC 125 MG IJ SOLR
60.0000 mg | Freq: Two times a day (BID) | INTRAMUSCULAR | Status: DC
  Administered 2018-02-10: 60 mg via INTRAVENOUS
  Filled 2018-02-10: qty 2

## 2018-02-10 MED ORDER — LORATADINE 10 MG PO TABS
10.0000 mg | ORAL_TABLET | Freq: Every day | ORAL | Status: DC
  Administered 2018-02-10 – 2018-02-11 (×2): 10 mg via ORAL
  Filled 2018-02-10 (×2): qty 1

## 2018-02-10 MED ORDER — ONDANSETRON HCL 4 MG/2ML IJ SOLN: 4.0000 mg | Freq: Four times a day (QID) | INTRAMUSCULAR | Status: DC | PRN

## 2018-02-10 MED ORDER — LEVOTHYROXINE SODIUM 88 MCG PO TABS
88.0000 ug | ORAL_TABLET | Freq: Every day | ORAL | Status: DC
Start: 2018-02-10 — End: 2018-02-11
  Administered 2018-02-10 – 2018-02-11 (×2): 88 ug via ORAL
  Filled 2018-02-10 (×3): qty 1

## 2018-02-10 MED ORDER — ACETAMINOPHEN 325 MG PO TABS
650.0000 mg | ORAL_TABLET | Freq: Four times a day (QID) | ORAL | Status: DC | PRN
Start: 2018-02-10 — End: 2018-02-11
  Administered 2018-02-11: 650 mg via ORAL
  Filled 2018-02-10: qty 2

## 2018-02-10 MED ORDER — ACETAMINOPHEN 650 MG RE SUPP: 650.0000 mg | Freq: Four times a day (QID) | RECTAL | Status: DC | PRN

## 2018-02-10 MED ORDER — METHYLPREDNISOLONE SODIUM SUCC 125 MG IJ SOLR
60.0000 mg | Freq: Two times a day (BID) | INTRAMUSCULAR | Status: AC
  Administered 2018-02-10: 60 mg via INTRAVENOUS
  Filled 2018-02-10: qty 2

## 2018-02-10 MED ORDER — FAMOTIDINE 20 MG PO TABS
20.0000 mg | ORAL_TABLET | Freq: Two times a day (BID) | ORAL | Status: DC
  Administered 2018-02-10 – 2018-02-11 (×2): 20 mg via ORAL
  Filled 2018-02-10 (×3): qty 1

## 2018-02-10 MED ORDER — ONDANSETRON HCL 4 MG PO TABS: 4.0000 mg | ORAL_TABLET | Freq: Four times a day (QID) | ORAL | Status: DC | PRN

## 2018-02-10 NOTE — ED Notes (Signed)
Admitting provider at bedside.

## 2018-02-10 NOTE — H&P (Signed)
History and Physical    Nancy MuscatHelene E Gibbs ZOX:[id number] DOB: Aug 11, 1961 DOA: 02/09/2018  PCP: Shirlean MylarWebb, Carol, MD  Patient coming from: Home  I have personally briefly reviewed patient's old medical records in [facility]  Chief Complaint: Allergic reaction  HPI: Nancy MuscatHelene E Gibbs is a 57 y.o. female with medical history significant of back pain, just had spinal surgery earlier this month.  Started on Oxycodone, tramadol, valium in post op-period.  Patient presented to ED yesterday with hives on legs and abdomen.  Thinks she had taken oxy and valium yesterday, not tramadol.  Went to ED, got solu-medrol, famotidine, benadryl.  Discharged on famotidine, atarax, prednisone.  Hives initially went away.  Took valium last evening to go to sleep.  Woke up this morning with recurrent hives, lip swelling, throat tightness.  Returns to ED.   ED Course: Given epinephrine, solumedrol, and benadryl in ED.  Hospitalist asked to admit for obs.   Review of Systems: As per HPI otherwise 10 point review of systems negative.   Past Medical History:  Diagnosis Date  . Anxiety   . Back pain   . Dysrhythmia    tachycardia-takes propanolol PRN  . Headache(784.0)    if meds not taken at specific time  . Hypothyroidism   . Pre-diabetes     Past Surgical History:  Procedure Laterality Date  . ABDOMINAL HYSTERECTOMY  ?1988  . BACK SURGERY  11/2012  . TONSILLECTOMY       reports that she has never smoked. She has never used smokeless tobacco. She reports that she drinks alcohol. She reports that she does not use drugs.  Allergies  Allergen Reactions  . Peanut-Containing Drug Products Anaphylaxis  . Pineapple Anaphylaxis  . Hydrocodone Hives and Itching    Scratching skin until bled.  . Oxycodone     Either versed or oxycodone caused hives and angioedema  . Versed [Midazolam]     Either versed or oxycodone caused hives and angioedema  . Other Rash    Some soaps and detergents, break out  skin rashes.  Pt will bring own soap.    Family History  Problem Relation Age of Onset  . Stroke Mother   . Peripheral vascular disease Mother   . Hyperlipidemia Father   . Cancer Father   . Diabetes Other      Prior to Admission medications   Medication Sig Start Date End Date Taking? Authorizing Provider  diphenhydrAMINE (BENADRYL) 25 mg capsule Take 25-50 mg by mouth every 6 (six) hours as needed for allergies.   Yes [provider]  EPINEPHrine (EPIPEN 2-PAK) 0.3 mg/0.3 mL IJ SOAJ injection Inject 0.3 mLs (0.3 mg total) into the muscle once as needed for up to 1 dose (allergic reaction). 02/08/18  Yes Ward, Chase PicketJaime Pilcher, PA-C  levothyroxine (SYNTHROID, LEVOTHROID) 88 MCG tablet Take 88 mcg by mouth daily before breakfast. 12/19/17  Yes [provider]  Multiple Vitamin (MULTIVITAMINS PO) Take 1 Scoop by mouth daily. [employer] Powder   Yes [provider]  oxyCODONE 10 MG TABS Take 0.5-1 tablets (5-10 mg total) by mouth every 3 (three) hours as needed for severe pain ((score 7 to 10)). 01/29/18  Yes Pool, Sherilyn CooterHenry, MD  polyethylene glycol (MIRALAX / GLYCOLAX) packet Take 17 g by mouth daily as needed (for constipation.).    Yes [provider]  predniSONE (DELTASONE) 20 MG tablet Take 2 tablets (40 mg total) by mouth daily. 02/08/18  Yes Ward, Chase PicketJaime Pilcher, PA-C  traMADol (ULTRAM) 50 MG tablet Take 1 tablet (50 mg total) by mouth every 6 (six) hours as needed. Patient taking differently: Take 50 mg by mouth daily as needed (for leg/back pain.).  10/01/13  Yes Baker, Zachary H, PA-C  famotidine (PEPCID) 20 MG tablet Take 1 tablet (20 mg total) by mouth 2 (two) times daily as needed (Itching, rash). 02/08/18   Ward, Chase Picket, PA-C  hydrOXYzine (ATARAX/VISTARIL) 25 MG tablet Take 1 tablet (25 mg total) by mouth every 6 (six) hours as needed for itching. 02/08/18   Ward, Chase Picket, PA-C    Physical Exam: Vitals:   02/09/18 2115  02/09/18 2300 02/09/18 2315 02/09/18 2330  BP: 137/86 131/81 138/85 133/86  Pulse: (!) 103  (!) 108   Resp: 16 (!) 32 (!) 27 (!) 21  Temp:      SpO2: 100%  97%   Weight:      Height:        Constitutional: NAD, calm, comfortable Eyes: PERRL, lids and conjunctivae normal ENMT: Mucous membranes are moist. Posterior pharynx clear of any exudate or lesions.Normal dentition.  Neck: normal, supple, no masses, no thyromegaly Respiratory: clear to auscultation bilaterally, no wheezing, no crackles. Normal respiratory effort. No accessory muscle use.  Cardiovascular: Regular rate and rhythm, no murmurs / rubs / gallops. No extremity edema. 2+ pedal pulses. No carotid bruits.  Abdomen: no tenderness, no masses palpated. No hepatosplenomegaly. Bowel sounds positive.  Musculoskeletal: no clubbing / cyanosis. No joint deformity upper and lower extremities. Good ROM, no contractures. Normal muscle tone.  Skin: no rashes, lesions, ulcers. No induration Neurologic: CN 2-12 grossly intact. Sensation intact, DTR normal. Strength 5/5 in all 4.  Psychiatric: Normal judgment and insight. Alert and oriented x 3. Normal mood.    Labs on Admission: I have personally reviewed following labs and imaging studies  CBC: No results for input(s): WBC, NEUTROABS, HGB, HCT, MCV, PLT in the last 168 hours. Basic Metabolic Panel: No results for input(s): NA, K, CL, CO2, GLUCOSE, BUN, CREATININE, CALCIUM, MG, PHOS in the last 168 hours. GFR: Estimated Creatinine Clearance: 89.9 mL/min (by C-G formula based on SCr of 0.78 mg/dL). Liver Function Tests: No results for input(s): AST, ALT, ALKPHOS, BILITOT, PROT, ALBUMIN in the last 168 hours. No results for input(s): LIPASE, AMYLASE in the last 168 hours. No results for input(s): AMMONIA in the last 168 hours. Coagulation Profile: No results for input(s): INR, PROTIME in the last 168 hours. Cardiac Enzymes: No results for input(s): CKTOTAL, CKMB, CKMBINDEX, TROPONINI  in the last 168 hours. BNP (last 3 results) No results for input(s): PROBNP in the last 8760 hours. HbA1C: No results for input(s): HGBA1C in the last 72 hours. CBG: No results for input(s): GLUCAP in the last 168 hours. Lipid Profile: No results for input(s): CHOL, HDL, LDLCALC, TRIG, CHOLHDL, LDLDIRECT in the last 72 hours. Thyroid Function Tests: No results for input(s): TSH, T4TOTAL, FREET4, T3FREE, THYROIDAB in the last 72 hours. Anemia Panel: No results for input(s): VITAMINB12, FOLATE, FERRITIN, TIBC, IRON, RETICCTPCT in the last 72 hours. Urine analysis: No results found for: COLORURINE, APPEARANCEUR, LABSPEC, PHURINE, GLUCOSEU, HGBUR, BILIRUBINUR, KETONESUR, PROTEINUR, UROBILINOGEN, NITRITE, LEUKOCYTESUR  Radiological Exams on Admission: Dg Chest Portable 1 View  Result Date: 02/09/2018 CLINICAL DATA:  Cough EXAM: PORTABLE CHEST 1 VIEW COMPARISON:  12/09/2012 FINDINGS: Cardiac shadow is within normal limits. Mild vascular congestion is noted with mild bibasilar atelectatic change. No bony abnormality is seen. IMPRESSION: Mild atelectatic changes and vascular congestion. Electronically  Signed   By: Alcide Clever M.D.   On: 02/09/2018 21:04    EKG: Independently reviewed.  Assessment/Plan Principal Problem:   Allergic angioedema Active Problems:   Lumbar stenosis with neurogenic claudication    1. Allergic angioedema - most suspicious of valium, though oxycodone (she gets itching to hydrocodone) and tramadol remain suspects 1. Stop valium, oxy, tramadol 2. Continue solumedrol 60mg  IV Q6H 3. Continue benadryl 4. Tele monitor 2. Lumbar stenosis - 1. Stopping meds as above 2. Will see if she develops pain and needs any of these meds (she says actually she hasnt really had much pain since surgery).  DVT prophylaxis: None, obs admit Code Status: Full Family Communication: Family at bedside Disposition Plan: Home likely in AM if / when improved Consults called:  None Admission status: Place in [place], Heywood Iles. DO [facility] Pager [phone]  If 7AM-7PM, please contact day team taking care of patient [email or web address] Password [facility]  02/10/2018, 12:53 AM

## 2018-02-10 NOTE — ED Notes (Signed)
Meal redirected to 2W

## 2018-02-10 NOTE — ED Notes (Signed)
[place] RN accepts report at this time

## 2018-02-10 NOTE — ED Notes (Signed)
PT eating lunch with family at bedside

## 2018-02-10 NOTE — Progress Notes (Signed)
Patient admitted after midnight.  Wean steroids-- continue pepcid, claratin. Home health PT  Marlin CanaryJessica Hady Niemczyk DO

## 2018-02-10 NOTE — Evaluation (Signed)
Physical Therapy Evaluation Patient Details Name: Nancy Gibbs MRN: [id number] DOB: [birth year] Today's Date: 02/10/2018   History of Present Illness  Pt is a 57 y/o female admitted secondary to allergic angioedema. Pt also with recent lumbar surgery. PMH includes lumbar surgery and pre-diabetes.   Clinical Impression  Pt admitted secondary to problem above with deficits below. Pt tolerated gait well this session and was eager to get up and walk. Required min guard A for mobility this session with use of RW. Educated about walking program and increasing safety at home, as pt with recent back surgery. Will continue to follow acutely to maximize functional mobility independence and safety.     Follow Up Recommendations Home health PT;Supervision for mobility/OOB    Equipment Recommendations  None recommended by PT    Recommendations for Other Services       Precautions / Restrictions Precautions Precautions: Back Precaution Booklet Issued: No Precaution Comments: Able to recite back precautions with cues.  Required Braces or Orthoses: Spinal Brace Spinal Brace: Lumbar corset;Applied in sitting position Restrictions Weight Bearing Restrictions: No      Mobility  Bed Mobility Overal bed mobility: Needs Assistance Bed Mobility: Rolling;Sidelying to Sit Rolling: Supervision Sidelying to sit: Supervision       General bed mobility comments: Supervision for safety. Increased time required.   Transfers Overall transfer level: Needs assistance Equipment used: Rolling walker (2 wheeled) Transfers: Sit to/from Stand Sit to Stand: Min guard         General transfer comment: Min guard for safety. INcreased time required. Verbal cues for safe hand placement.   Ambulation/Gait Ambulation/Gait assistance: Min guard Ambulation Distance (Feet): 150 Feet Assistive device: Rolling walker (2 wheeled) Gait Pattern/deviations: Step-through pattern;Decreased stride length;Trunk  flexed Gait velocity: Decreased  Gait velocity interpretation: <1.8 ft/sec, indicate of risk for recurrent falls General Gait Details: Slow, guarded gait. No LOB noted. Verbal cues for upright posture. Educated about generalized walking program to perform at home.   Stairs Stairs: Yes       General stair comments: Edcuated about safe stair management at home and use of step to technique.   Wheelchair Mobility    Modified Rankin (Stroke Patients Only)       Balance Overall balance assessment: Needs assistance Sitting-balance support: No upper extremity supported;Feet supported Sitting balance-Leahy Scale: Good     Standing balance support: Bilateral upper extremity supported;During functional activity Standing balance-Leahy Scale: Poor Standing balance comment: Reliant on BUE support for balance.                              Pertinent Vitals/Pain Pain Assessment: Faces Faces Pain Scale: Hurts little more Pain Location: back Pain Descriptors / Indicators: Discomfort;Sore Pain Intervention(s): Limited activity within patient's tolerance;Monitored during session;Repositioned    Home Living Family/patient expects to be discharged to:: Private residence Living Arrangements: Alone Available Help at Discharge: Family;Available 24 hours/day Type of Home: Apartment Home Access: Stairs to enter Entrance Stairs-Rails: Right Entrance Stairs-Number of Steps: 16 Home Layout: One level Home Equipment: Walker - 2 wheels;Shower seat;Bedside commode      Prior Function Level of Independence: Independent with assistive device(s)         Comments: Reports she has been using RW since back surgery and pt's mother has provided supervision. Pt reports 1 fall at home.      Hand Dominance   Dominant Hand: Right    Extremity/Trunk Assessment   Upper Extremity  Assessment Upper Extremity Assessment: Defer to OT evaluation    Lower Extremity Assessment Lower  Extremity Assessment: Generalized weakness    Cervical / Trunk Assessment Cervical / Trunk Assessment: Other exceptions Cervical / Trunk Exceptions: recent lumbar surgery   Communication   Communication: No difficulties  Cognition Arousal/Alertness: Awake/alert Behavior During Therapy: WFL for tasks assessed/performed Overall Cognitive Status: Within Functional Limits for tasks assessed                                        General Comments General comments (skin integrity, edema, etc.): Educated about removing throw rugs to decrease fall risk.     Exercises     Assessment/Plan    PT Assessment Patient needs continued PT services  PT Problem List Decreased balance;Decreased mobility;Decreased knowledge of use of DME;Decreased knowledge of precautions;Pain;Decreased strength       PT Treatment Interventions DME instruction;Gait training;Stair training;Functional mobility training;Therapeutic exercise;Therapeutic activities;Balance training;Neuromuscular re-education;Patient/family education    PT Goals (Current goals can be found in the Care Plan section)  Acute Rehab PT Goals Patient Stated Goal: to get better PT Goal Formulation: With patient Time For Goal Achievement: 02/24/18 Potential to Achieve Goals: Good    Frequency Min 3X/week   Barriers to discharge        Co-evaluation               AM-PAC PT "6 Clicks" Daily Activity  Outcome Measure Difficulty turning over in bed (including adjusting bedclothes, sheets and blankets)?: A Little Difficulty moving from lying on back to sitting on the side of the bed? : A Little Difficulty sitting down on and standing up from a chair with arms (e.g., wheelchair, bedside commode, etc,.)?: Unable Help needed moving to and from a bed to chair (including a wheelchair)?: A Little Help needed walking in hospital room?: A Little Help needed climbing 3-5 steps with a railing? : A Little 6 Click Score: 16     End of Session Equipment Utilized During Treatment: Gait belt;Back brace Activity Tolerance: Patient tolerated treatment well Patient left: in bed;with call bell/phone within reach;with family/visitor present(pt sitting at EOB ) Nurse Communication: Mobility status PT Visit Diagnosis: Other abnormalities of gait and mobility (R26.89);Pain Pain - part of body: (back )    Time: 1610-9604 PT Time Calculation (min) (ACUTE ONLY): 46 min   Charges:   PT Evaluation $PT Eval Low Complexity: 1 Low PT Treatments $Gait Training: 8-22 mins $Therapeutic Activity: 8-22 mins   PT G Codes:        Gladys Damme, PT, DPT  Acute Rehabilitation Services  Pager: [phone]   Lehman Prom 02/10/2018, 1:39 PM

## 2018-02-10 NOTE — ED Notes (Signed)
The pt is c/o back pain especially with movement.  Pur wick placed  Pt c/o  More back pain with movement and being placed on the bedpan

## 2018-02-11 DIAGNOSIS — T783XXA Angioneurotic edema, initial encounter: Secondary | ICD-10-CM | POA: Diagnosis not present

## 2018-02-11 DIAGNOSIS — M48062 Spinal stenosis, lumbar region with neurogenic claudication: Secondary | ICD-10-CM | POA: Diagnosis not present

## 2018-02-11 DIAGNOSIS — E039 Hypothyroidism, unspecified: Secondary | ICD-10-CM | POA: Diagnosis not present

## 2018-02-11 DIAGNOSIS — T783XXD Angioneurotic edema, subsequent encounter: Secondary | ICD-10-CM | POA: Diagnosis not present

## 2018-02-11 DIAGNOSIS — Z79890 Hormone replacement therapy (postmenopausal): Secondary | ICD-10-CM | POA: Diagnosis not present

## 2018-02-11 MED ORDER — ACETAMINOPHEN 325 MG PO TABS: 650.0000 mg | ORAL_TABLET | Freq: Four times a day (QID) | ORAL | Status: AC | PRN

## 2018-02-11 MED ORDER — LORATADINE 10 MG PO TABS: 10.0000 mg | ORAL_TABLET | Freq: Every day | ORAL | 0 refills | Status: DC

## 2018-02-11 NOTE — Progress Notes (Addendum)
Verner MouldJ. Vann paged the following at 1715:  "2W16:[place]ata. Rejoice. D/C patient with questions about d/c. Please call pts. room [phone] or stop in if available. Thank you."  1717: Thank you Dr. Benjamine MolaVann for returning page and answering patients questions. Patients D/C is delayed due to daughters availability to pick patient up. Taxi offered and declined.

## 2018-02-11 NOTE — Discharge Summary (Signed)
Physician Discharge Summary  Nancy MuscatHelene E Dupree [id number] DOB: 10-30-1960 DOA: 02/09/2018  PCP: Shirlean MylarWebb, Carol, MD  Admit date: 02/09/2018 Discharge date: 02/11/2018   Recommendations for Outpatient Follow-Up:   1. Referral to allergy-- unsure of cause of allergic rxn: valium vs oxycodone---- after 1st ER visit, patient did not pick up medications called in 2. BMP, CBC at next visit 3. Home health ordered   Discharge Diagnosis:   Principal Problem:   Allergic angioedema Active Problems:   Lumbar stenosis with neurogenic claudication   Discharge disposition:  Home.  Discharge Condition: Improved.  Diet recommendation: Low sodium, heart healthy.  Wound care: None.   History of Present Illness:   Nancy Gibbs is a 57 y.o. female with medical history significant of back pain, just had spinal surgery earlier this month.  Started on Oxycodone, tramadol, valium in post op-period.  Patient presented to ED yesterday with hives on legs and abdomen.  Thinks she had taken oxy and valium yesterday, not tramadol.  Went to ED, got solu-medrol, famotidine, benadryl.  Discharged on famotidine, atarax, prednisone.  Hives initially went away.  Took valium last evening to go to sleep.  Woke up this morning with recurrent hives, lip swelling, throat tightness.  Returns to ED.      Hospital Course by Problem:   Allergic rxn -steroid taper, epi pen, pepcid and benadyl -? Allergy referral  Recent surgery -PRN tylenol -home health  Patient's exam and complaints were inconsistent--- c/o sore thoat/difficulty swallowing yet eating LARGE forkfuls of food at 1 time. Needing assitance when PT/OT were present but when alone or with tech able to do thing by self.      Medical Consultants:    None.   Discharge Exam:   Vitals:   02/10/18 2300 02/11/18 0733  BP: 128/61 115/71  Pulse: 98 83  Resp: 20 18  Temp: 98.2 F (36.8 C)   SpO2: 100%    Vitals:   02/10/18 1630  02/10/18 1745 02/10/18 2300 02/11/18 0733  BP: 127/61 122/76 128/61 115/71  Pulse: (!) 103 94 98 83  Resp: (!) 21 16 20 18   Temp:  98.2 F (36.8 C) 98.2 F (36.8 C)   TempSrc:  Oral Oral   SpO2: 96% 100% 100%   Weight:  95.6 kg (210 lb 12.2 oz)    Height:  5\' 5"  (1.651 m)      Gen:  NAD Cardiovascular:  RRR, No M/R/G Respiratory: Lungs CTAB Gastrointestinal: Abdomen soft, NT/ND with normal active bowel sounds. Extremities: No C/E/C   The results of significant diagnostics from this hospitalization (including imaging, microbiology, ancillary and laboratory) are listed below for reference.     Procedures and Diagnostic Studies:   Dg Chest Portable 1 View  Result Date: 02/09/2018 CLINICAL DATA:  Cough EXAM: PORTABLE CHEST 1 VIEW COMPARISON:  12/09/2012 FINDINGS: Cardiac shadow is within normal limits. Mild vascular congestion is noted with mild bibasilar atelectatic change. No bony abnormality is seen. IMPRESSION: Mild atelectatic changes and vascular congestion. Electronically Signed   By: Alcide CleverMark  Lukens M.D.   On: 02/09/2018 21:04     Labs:   Basic Metabolic Panel: Recent Labs  Lab 02/10/18 0252  NA 138  K 3.7  CL 109  CO2 22  GLUCOSE 167*  BUN 14  CREATININE 0.92  CALCIUM 8.2*   GFR Estimated Creatinine Clearance: 78 mL/min (by C-G formula based on SCr of 0.92 mg/dL). Liver Function Tests: No results for input(s): AST, ALT, ALKPHOS, BILITOT, PROT, ALBUMIN  in the last 168 hours. No results for input(s): LIPASE, AMYLASE in the last 168 hours. No results for input(s): AMMONIA in the last 168 hours. Coagulation profile No results for input(s): INR, PROTIME in the last 168 hours.  CBC: Recent Labs  Lab 02/10/18 0252  WBC 21.0*  NEUTROABS 19.7*  HGB 11.5*  HCT 35.0*  MCV 83.3  PLT 470*   Cardiac Enzymes: No results for input(s): CKTOTAL, CKMB, CKMBINDEX, TROPONINI in the last 168 hours. BNP: Invalid input(s): POCBNP CBG: No results for input(s): GLUCAP  in the last 168 hours. D-Dimer No results for input(s): DDIMER in the last 72 hours. Hgb A1c No results for input(s): HGBA1C in the last 72 hours. Lipid Profile No results for input(s): CHOL, HDL, LDLCALC, TRIG, CHOLHDL, LDLDIRECT in the last 72 hours. Thyroid function studies No results for input(s): TSH, T4TOTAL, T3FREE, THYROIDAB in the last 72 hours.  Invalid input(s): FREET3 Anemia work up No results for input(s): VITAMINB12, FOLATE, FERRITIN, TIBC, IRON, RETICCTPCT in the last 72 hours. Microbiology No results found for this or any previous visit (from the past 240 hour(s)).   Discharge Instructions:   Discharge Instructions    Discharge instructions   Complete by:  As directed    Home health Soft diet Referral to allergy   Increase activity slowly   Complete by:  As directed      Allergies as of 02/11/2018      Reactions   Peanut-containing Drug Products Anaphylaxis   Pineapple Anaphylaxis   Hydrocodone Hives, Itching   Scratching skin until bled.   Oxycodone    Either versed or oxycodone caused hives and angioedema   Versed [midazolam]    Either versed or oxycodone caused hives and angioedema   Other Rash   Some soaps and detergents, break out skin rashes.  Pt will bring own soap.      Medication List    STOP taking these medications   Oxycodone HCl 10 MG Tabs   traMADol 50 MG tablet Commonly known as:  ULTRAM     TAKE these medications   acetaminophen 325 MG tablet Commonly known as:  TYLENOL Take 2 tablets (650 mg total) by mouth every 6 (six) hours as needed for mild pain (or Fever >/= 101). Max of 4000 mg of tylenol/day   diphenhydrAMINE 25 mg capsule Commonly known as:  BENADRYL Take 25-50 mg by mouth every 6 (six) hours as needed for allergies.   EPINEPHrine 0.3 mg/0.3 mL Soaj injection Commonly known as:  EPIPEN 2-PAK Inject 0.3 mLs (0.3 mg total) into the muscle once as needed for up to 1 dose (allergic reaction).   famotidine 20 MG  tablet Commonly known as:  PEPCID Take 1 tablet (20 mg total) by mouth 2 (two) times daily as needed (Itching, rash).   hydrOXYzine 25 MG tablet Commonly known as:  ATARAX/VISTARIL Take 1 tablet (25 mg total) by mouth every 6 (six) hours as needed for itching.   levothyroxine 88 MCG tablet Commonly known as:  SYNTHROID, LEVOTHROID Take 88 mcg by mouth daily before breakfast.   loratadine 10 MG tablet Commonly known as:  CLARITIN Take 1 tablet (10 mg total) by mouth daily. Start taking on:  02/12/2018   MULTIVITAMINS PO Take 1 Scoop by mouth daily. [employer] Powder   polyethylene glycol packet Commonly known as:  MIRALAX / GLYCOLAX Take 17 g by mouth daily as needed (for constipation.).   predniSONE 20 MG tablet Commonly known as:  DELTASONE  Take 2 tablets (40 mg total) by mouth daily.      Follow-up Information    Shirlean Mylar, MD Follow up in 1 week(s).   Specialty:  Family Medicine Why:  referral to allergy Contact information: [address] [place] [address] [phone]            Time coordinating discharge: 35 min  Signed:  Joseph Art   [facility] 02/11/2018, 12:40 PM

## 2018-02-11 NOTE — Evaluation (Signed)
Occupational Therapy Evaluation Patient Details Name: Nancy Gibbs MRN: [id number] DOB: [birth year] Today's Date: 02/11/2018    History of Present Illness Pt is a 57 y/o female admitted secondary to allergic angioedema. Pt also with recent lumbar surgery. PMH includes lumbar surgery and pre-diabetes.    Clinical Impression   PT admitted with allergic angioedema. Pt currently with functional limitiations due to the deficits listed below (see OT problem list). Pt currently at supervision level and cues to safety due to back precautions. Pt is able to verbalize precautions but with poor return demo. Pt will have mother (A) upon d/c for two days ( leaving 02/13/18). Pt requesting information on an aide for adls. Pt will benefit from skilled OT to increase their independence and safety with adls and balance to allow discharge home.     Follow Up Recommendations  (requesting AIde due to home alone)    Equipment Recommendations  Other (comment)(has all DME from previous admission)    Recommendations for Other Services       Precautions / Restrictions Precautions Precautions: Back Precaution Comments: pt able to verbalize back precautions however with poor return demo of all precautions Required Braces or Orthoses: Spinal Brace Spinal Brace: Lumbar corset;Applied in sitting position Restrictions Weight Bearing Restrictions: No      Mobility Bed Mobility               General bed mobility comments: sitting eob on arrival. HOB elevated. [employer] reports patient completed indep prior to his arrival and don brace  Transfers Overall transfer level: Needs assistance Equipment used: Rolling walker (2 wheeled) Transfers: Sit to/from Stand Sit to Stand: Supervision         General transfer comment: pt with RW use and cues for proper position inside the RW    Balance Overall balance assessment: Needs assistance Sitting-balance support: Feet supported Sitting balance-Leahy  Scale: Good     Standing balance support: Single extremity supported;During functional activity Standing balance-Leahy Scale: Good Standing balance comment: pt able to use bil Ue at sink without support                           ADL either performed or assessed with clinical judgement   ADL Overall ADL's : Needs assistance/impaired Eating/Feeding: Independent   Grooming: Supervision/safety;Standing;Wash/dry Electrical engineer Transfer: Supervision/safety;BSC;RW   Toileting- Clothing Manipulation and Hygiene: Supervision/safety Toileting - Clothing Manipulation Details (indicate cue type and reason): cue to avoid bending and twisting while in the bathroom this session Tub/ Shower Transfer: Supervision/safety;Tub bench Tub/Shower Transfer Details (indicate cue type and reason): pt reports stepping over the tub and then pulling the shower seat toward the faucet. Pt educated not to move the seat and rather leave it in one location. pt reports she was moving it for her mother. Pt then explains that mother has multiple medical issues and after questioning reports mother is using the seat too. Pt educated to not pick up the seat Functional mobility during ADLs: Supervision/safety;Rolling walker General ADL Comments: pt educated on proper placement of brace due to brace moving slightly too high when wearing it. Brace is from first back surg and pt s/p 2nd surg earlier this month (april 2019)      Vision Patient Visual Report: No change from baseline       Perception     Praxis  Pertinent Vitals/Pain Pain Assessment: Faces Faces Pain Scale: Hurts a little bit Pain Location: back Pain Descriptors / Indicators: Discomfort(itching) Pain Intervention(s): Limited activity within patient's tolerance;Premedicated before session;Repositioned     Hand Dominance Right   Extremity/Trunk Assessment Upper Extremity Assessment Upper Extremity Assessment:  Overall WFL for tasks assessed   Lower Extremity Assessment Lower Extremity Assessment: Defer to PT evaluation   Cervical / Trunk Assessment Cervical / Trunk Assessment: Other exceptions Cervical / Trunk Exceptions: recent back surgery seen by OT on 01/29/18   Communication Communication Communication: No difficulties   Cognition Arousal/Alertness: Awake/alert Behavior During Therapy: WFL for tasks assessed/performed Overall Cognitive Status: Within Functional Limits for tasks assessed                                     General Comments       Exercises     Shoulder Instructions      Home Living Family/patient expects to be discharged to:: Private residence Living Arrangements: Alone Available Help at Discharge: Family;Available PRN/intermittently(mother here for 2 days leaving 4/25 ) Type of Home: Apartment Home Access: Stairs to enter Entrance Stairs-Number of Steps: 16 Entrance Stairs-Rails: Right Home Layout: One level     Bathroom Shower/Tub: Chief Strategy OfficerTub/shower unit   Bathroom Toilet: Standard Bathroom Accessibility: Yes   Home Equipment: Environmental consultantWalker - 2 wheels;Shower seat;Bedside commode   Additional Comments: mother currently here from out of state but leaving 4/25 per patient. Pt reports that daughter is due any day to deliver baby and unavailable to help. Pt's siblinings live out of town and unabl to (A). Pt will be alone and dependent on PRN worker help to stop by       Prior Functioning/Environment Level of Independence: Independent with assistive device(s)        Comments: Reports she has been using RW since back surgery and pt's mother has provided supervision. Pt reports 1 fall at home. (when exiting the bathroom)        OT Problem List: Decreased activity tolerance;Decreased knowledge of precautions;Decreased knowledge of use of DME or AE;Obesity;Decreased safety awareness;Impaired balance (sitting and/or standing)      OT  Treatment/Interventions: Self-care/ADL training;Therapeutic exercise;Therapeutic activities;DME and/or AE instruction;Patient/family education;Balance training    OT Goals(Current goals can be found in the care plan section) Acute Rehab OT Goals Patient Stated Goal: to get better and to have help at home OT Goal Formulation: With patient Time For Goal Achievement: 02/25/18 Potential to Achieve Goals: Good  OT Frequency: Min 2X/week   Barriers to D/C:            Co-evaluation              AM-PAC PT "6 Clicks" Daily Activity     Outcome Measure Help from another person eating meals?: None Help from another person taking care of personal grooming?: None Help from another person toileting, which includes using toliet, bedpan, or urinal?: None Help from another person bathing (including washing, rinsing, drying)?: A Little Help from another person to put on and taking off regular upper body clothing?: None Help from another person to put on and taking off regular lower body clothing?: A Little 6 Click Score: 22   End of Session Equipment Utilized During Treatment: Rolling walker Nurse Communication: Mobility status;Precautions  Activity Tolerance: Patient tolerated treatment well Patient left: in chair;with call bell/phone within reach  OT Visit Diagnosis: Unsteadiness on feet (R26.81);History of  falling (Z91.81)                Time: 1610-9604 OT Time Calculation (min): 29 min Charges:  OT General Charges $OT Visit: 1 Visit OT Evaluation $OT Eval Moderate Complexity: 1 Mod G-Codes:      Mateo Flow   OTR/L Pager: [phone] Office: [phone] .   Boone Master B 02/11/2018, 9:17 AM

## 2018-02-11 NOTE — Care Management Note (Signed)
Case Management Note  Patient Details  Name: Nancy Gibbs MRN: [id number] Date of Birth: [birth year]  Subjective/Objective:   From home alone, presents with allergic angioedema. Pt also with recent lumbar surgery, she has DME at home from previous surgery , she does not need any additional DME.  Per PT eval rec HHPT, OT, aide, NCM [facility] [place] [facility], [facility], [facility] [place] [facility] [place] [facility] soc will begin 24-48 hrs post dc.                   Action/Plan: DC home with HHPT, OT and aide with [facility].   Expected Discharge Date:  02/11/18               Expected Discharge Plan:  Home w Home Health Services  In-House Referral:     Discharge planning Services  CM Consult  Post Acute Care Choice:    Choice offered to:  Patient  DME Arranged:    DME Agency:     HH Arranged:  PT, OT, Nurse's Aide HH Agency:  [facility]  Status of Service:  Completed, signed off  If discussed at Long Length of Stay Meetings, dates discussed:    Additional Comments:  Leone Havenaylor, Riah Kehoe [place], RN 02/11/2018, 12:52 PM

## 2018-02-13 ENCOUNTER — Other Ambulatory Visit: Payer: Self-pay

## 2018-02-13 NOTE — Patient Outreach (Signed)
[facility] Care Management  02/13/2018  Nancy Gibbs 1961-04-02 [id number]   Telephone call for transition of care call.  Member was hospitalized for observation for angioedema of the lips on 02/10/18 and discharged on 02/11/18.  Subjective:  Member states she is slowly starting to feel better but she still has itching and hives at times.  States she spoke with Dr.Webb and she is to keep her appt on 03/05/18 but she can call her sooner if needed.  States she plans to send her to see and allergist.  States she is taking her antihistamines and prednisone as ordered.  States she has the Epipen if needed.  States she is having difficulty with her hospital bills even though she has [facility] also.   States [facility] has contacted her but the have not been out to visit yet.  States her daughter, niece and Mother have been helping her but their time is limited.  States she is recovering from her lumbar surgery and is trying to walk with her walker around her home.  States she is waiting for her short term disability to start paying.    Objective: Discharged with dx of angioedema of the lips. S/p  L3-4 Posterior lumbar interbody fusion 01/27/18. Patient also has a history of hypothyroidism.   Assessment:  Transition of care call completed.  See Transition of care flow sheet Reviewed discharge medications and member has medications Reviewed s/s to call MD or when to call 911 Instructed to schedule a sooner appt with Dr. Hyman HopesWebb if she does not improve Instructed to call the Benefits line with questions and to call [facility] customer service for questions about her coordination of benefits with [facility] Instructed on the Caring for Each Other Fund and plan to mail information and application to member Encouraged to contact the [employer] Assistance Counseling Program.  Given contact number and brochure to be mailed to her. Member given number to [employer] to enroll in the  Healthy Lifestyle Premium program and information sheet to be sent to her.  Plan: Plan to close case.  Member assessed with no further interventions needed Plan to mail successful outreach letter, Regulatory affairs officermployee Assistance Counseling Program brochure, Caring for each other Fund application, and Healthy rewards program flyer  Dudley MajorMelissa Mariel Lukins RN, [facility],CCM Care Management Coordinator [facility] Care Management [phone]

## 2018-02-14 DIAGNOSIS — Z7952 Long term (current) use of systemic steroids: Secondary | ICD-10-CM | POA: Diagnosis not present

## 2018-02-14 DIAGNOSIS — E039 Hypothyroidism, unspecified: Secondary | ICD-10-CM | POA: Diagnosis not present

## 2018-02-14 DIAGNOSIS — M48062 Spinal stenosis, lumbar region with neurogenic claudication: Secondary | ICD-10-CM | POA: Diagnosis not present

## 2018-02-14 DIAGNOSIS — R000 Tachycardia, unspecified: Secondary | ICD-10-CM | POA: Diagnosis not present

## 2018-02-14 DIAGNOSIS — R7303 Prediabetes: Secondary | ICD-10-CM | POA: Diagnosis not present

## 2018-02-14 DIAGNOSIS — F419 Anxiety disorder, unspecified: Secondary | ICD-10-CM | POA: Diagnosis not present

## 2018-02-14 DIAGNOSIS — T782XXD Anaphylactic shock, unspecified, subsequent encounter: Secondary | ICD-10-CM | POA: Diagnosis not present

## 2018-02-19 DIAGNOSIS — R000 Tachycardia, unspecified: Secondary | ICD-10-CM | POA: Diagnosis not present

## 2018-02-19 DIAGNOSIS — M48062 Spinal stenosis, lumbar region with neurogenic claudication: Secondary | ICD-10-CM | POA: Diagnosis not present

## 2018-02-19 DIAGNOSIS — Z7952 Long term (current) use of systemic steroids: Secondary | ICD-10-CM | POA: Diagnosis not present

## 2018-02-19 DIAGNOSIS — R7303 Prediabetes: Secondary | ICD-10-CM | POA: Diagnosis not present

## 2018-02-19 DIAGNOSIS — F419 Anxiety disorder, unspecified: Secondary | ICD-10-CM | POA: Diagnosis not present

## 2018-02-19 DIAGNOSIS — E039 Hypothyroidism, unspecified: Secondary | ICD-10-CM | POA: Diagnosis not present

## 2018-02-21 DIAGNOSIS — R000 Tachycardia, unspecified: Secondary | ICD-10-CM | POA: Diagnosis not present

## 2018-02-21 DIAGNOSIS — F419 Anxiety disorder, unspecified: Secondary | ICD-10-CM | POA: Diagnosis not present

## 2018-02-21 DIAGNOSIS — R7303 Prediabetes: Secondary | ICD-10-CM | POA: Diagnosis not present

## 2018-02-21 DIAGNOSIS — E039 Hypothyroidism, unspecified: Secondary | ICD-10-CM | POA: Diagnosis not present

## 2018-02-21 DIAGNOSIS — Z7952 Long term (current) use of systemic steroids: Secondary | ICD-10-CM | POA: Diagnosis not present

## 2018-02-21 DIAGNOSIS — M48062 Spinal stenosis, lumbar region with neurogenic claudication: Secondary | ICD-10-CM | POA: Diagnosis not present

## 2018-02-23 ENCOUNTER — Emergency Department (HOSPITAL_COMMUNITY): Payer: 59

## 2018-02-23 ENCOUNTER — Encounter (HOSPITAL_COMMUNITY): Payer: Self-pay | Admitting: Emergency Medicine

## 2018-02-23 ENCOUNTER — Inpatient Hospital Stay (HOSPITAL_COMMUNITY)
Admission: EM | Admit: 2018-02-23 | Discharge: 2018-02-27 | DRG: 916 | Disposition: A | Discharge status: Home Health | Payer: 59 | Attending: Internal Medicine | Admitting: Internal Medicine

## 2018-02-23 ENCOUNTER — Other Ambulatory Visit: Payer: Self-pay

## 2018-02-23 DIAGNOSIS — M1612 Unilateral primary osteoarthritis, left hip: Secondary | ICD-10-CM | POA: Diagnosis present

## 2018-02-23 DIAGNOSIS — Z981 Arthrodesis status: Secondary | ICD-10-CM

## 2018-02-23 DIAGNOSIS — T783XXA Angioneurotic edema, initial encounter: Secondary | ICD-10-CM | POA: Diagnosis not present

## 2018-02-23 DIAGNOSIS — K219 Gastro-esophageal reflux disease without esophagitis: Secondary | ICD-10-CM | POA: Diagnosis not present

## 2018-02-23 DIAGNOSIS — Z8249 Family history of ischemic heart disease and other diseases of the circulatory system: Secondary | ICD-10-CM

## 2018-02-23 DIAGNOSIS — M1712 Unilateral primary osteoarthritis, left knee: Secondary | ICD-10-CM | POA: Diagnosis present

## 2018-02-23 DIAGNOSIS — R112 Nausea with vomiting, unspecified: Secondary | ICD-10-CM

## 2018-02-23 DIAGNOSIS — M19041 Primary osteoarthritis, right hand: Secondary | ICD-10-CM | POA: Diagnosis present

## 2018-02-23 DIAGNOSIS — T7840XA Allergy, unspecified, initial encounter: Secondary | ICD-10-CM | POA: Diagnosis not present

## 2018-02-23 DIAGNOSIS — T783XXD Angioneurotic edema, subsequent encounter: Secondary | ICD-10-CM | POA: Diagnosis not present

## 2018-02-23 DIAGNOSIS — R1310 Dysphagia, unspecified: Secondary | ICD-10-CM | POA: Diagnosis present

## 2018-02-23 DIAGNOSIS — R739 Hyperglycemia, unspecified: Secondary | ICD-10-CM | POA: Diagnosis not present

## 2018-02-23 DIAGNOSIS — Z809 Family history of malignant neoplasm, unspecified: Secondary | ICD-10-CM

## 2018-02-23 DIAGNOSIS — I10 Essential (primary) hypertension: Secondary | ICD-10-CM | POA: Diagnosis not present

## 2018-02-23 DIAGNOSIS — M19042 Primary osteoarthritis, left hand: Secondary | ICD-10-CM | POA: Diagnosis not present

## 2018-02-23 DIAGNOSIS — Z90710 Acquired absence of both cervix and uterus: Secondary | ICD-10-CM

## 2018-02-23 DIAGNOSIS — Z79890 Hormone replacement therapy (postmenopausal): Secondary | ICD-10-CM

## 2018-02-23 DIAGNOSIS — Z8349 Family history of other endocrine, nutritional and metabolic diseases: Secondary | ICD-10-CM

## 2018-02-23 DIAGNOSIS — Z91018 Allergy to other foods: Secondary | ICD-10-CM

## 2018-02-23 DIAGNOSIS — Z823 Family history of stroke: Secondary | ICD-10-CM

## 2018-02-23 DIAGNOSIS — E890 Postprocedural hypothyroidism: Secondary | ICD-10-CM | POA: Diagnosis present

## 2018-02-23 DIAGNOSIS — M48062 Spinal stenosis, lumbar region with neurogenic claudication: Secondary | ICD-10-CM | POA: Diagnosis not present

## 2018-02-23 DIAGNOSIS — T380X5A Adverse effect of glucocorticoids and synthetic analogues, initial encounter: Secondary | ICD-10-CM | POA: Diagnosis present

## 2018-02-23 DIAGNOSIS — Z91010 Allergy to peanuts: Secondary | ICD-10-CM

## 2018-02-23 DIAGNOSIS — R05 Cough: Secondary | ICD-10-CM | POA: Diagnosis not present

## 2018-02-23 DIAGNOSIS — R229 Localized swelling, mass and lump, unspecified: Secondary | ICD-10-CM | POA: Diagnosis not present

## 2018-02-23 DIAGNOSIS — D72829 Elevated white blood cell count, unspecified: Secondary | ICD-10-CM | POA: Diagnosis present

## 2018-02-23 DIAGNOSIS — Z885 Allergy status to narcotic agent status: Secondary | ICD-10-CM

## 2018-02-23 DIAGNOSIS — Z888 Allergy status to other drugs, medicaments and biological substances status: Secondary | ICD-10-CM

## 2018-02-23 DIAGNOSIS — R0602 Shortness of breath: Secondary | ICD-10-CM | POA: Diagnosis not present

## 2018-02-23 LAB — CBC WITH DIFFERENTIAL/PLATELET
BASOS ABS: 0 10*3/uL (ref 0.0–0.1)
BASOS PCT: 0 %
EOS ABS: 0.1 10*3/uL (ref 0.0–0.7)
EOS PCT: 1 %
HCT: 39.3 % (ref 36.0–46.0)
HEMOGLOBIN: 12.7 g/dL (ref 12.0–15.0)
LYMPHS PCT: 15 %
Lymphs Abs: 1.6 10*3/uL (ref 0.7–4.0)
MCH: 27 pg (ref 26.0–34.0)
MCHC: 32.3 g/dL (ref 30.0–36.0)
MCV: 83.4 fL (ref 78.0–100.0)
MONO ABS: 0.3 10*3/uL (ref 0.1–1.0)
MONOS PCT: 3 %
NEUTROS ABS: 8.5 10*3/uL — AB (ref 1.7–7.7)
Neutrophils Relative %: 81 %
PLATELETS: 394 10*3/uL (ref 150–400)
RBC: 4.71 MIL/uL (ref 3.87–5.11)
RDW: 14.4 % (ref 11.5–15.5)
WBC: 10.4 10*3/uL (ref 4.0–10.5)

## 2018-02-23 LAB — COMPREHENSIVE METABOLIC PANEL
ALBUMIN: 3.4 g/dL — AB (ref 3.5–5.0)
ALT: 23 U/L (ref 14–54)
ANION GAP: 8 (ref 5–15)
AST: 21 U/L (ref 15–41)
Alkaline Phosphatase: 99 U/L (ref 38–126)
CO2: 25 mmol/L (ref 22–32)
Calcium: 9 mg/dL (ref 8.9–10.3)
Chloride: 107 mmol/L (ref 101–111)
Creatinine, Ser: 0.95 mg/dL (ref 0.44–1.00)
GFR calc Af Amer: >60 mL/min (ref >60)
GFR calc non Af Amer: >60 mL/min (ref >60)
GLUCOSE: 121 mg/dL — AB (ref 65–99)
POTASSIUM: 4.2 mmol/L (ref 3.5–5.1)
SODIUM: 140 mmol/L (ref 135–145)
Total Bilirubin: 0.3 mg/dL (ref 0.3–1.2)
Total Protein: 7.3 g/dL (ref 6.5–8.1)

## 2018-02-23 LAB — T4, FREE: FREE T4: 0.96 ng/dL (ref 0.82–1.77)

## 2018-02-23 LAB — PROTIME-INR
INR: 0.94
Prothrombin Time: 12.5 seconds (ref 11.4–15.2)

## 2018-02-23 LAB — TSH: TSH: 0.178 u[IU]/mL — ABNORMAL LOW (ref 0.350–4.500)

## 2018-02-23 LAB — SEDIMENTATION RATE: SED RATE: 35 mm/h — AB (ref 0–22)

## 2018-02-23 LAB — C-REACTIVE PROTEIN: CRP: <0.8 mg/dL (ref <1.0)

## 2018-02-23 MED ORDER — METHYLPREDNISOLONE SODIUM SUCC 125 MG IJ SOLR
125.0000 mg | Freq: Once | INTRAMUSCULAR | Status: AC
  Administered 2018-02-23: 125 mg via INTRAVENOUS
  Filled 2018-02-23: qty 2

## 2018-02-23 MED ORDER — ACETAMINOPHEN 650 MG RE SUPP: 650.0000 mg | Freq: Four times a day (QID) | RECTAL | Status: DC | PRN

## 2018-02-23 MED ORDER — SODIUM CHLORIDE 0.9 % IV SOLN
INTRAVENOUS | Status: AC
  Administered 2018-02-24: 1 mL via INTRAVENOUS

## 2018-02-23 MED ORDER — METHYLPREDNISOLONE SODIUM SUCC 40 MG IJ SOLR
40.0000 mg | Freq: Two times a day (BID) | INTRAMUSCULAR | Status: DC
  Administered 2018-02-24 – 2018-02-25 (×3): 40 mg via INTRAVENOUS
  Filled 2018-02-23 (×3): qty 1

## 2018-02-23 MED ORDER — DIPHENHYDRAMINE HCL 50 MG/ML IJ SOLN: 25.0000 mg | Freq: Four times a day (QID) | INTRAMUSCULAR | Status: DC | PRN

## 2018-02-23 MED ORDER — FAMOTIDINE IN NACL 20-0.9 MG/50ML-% IV SOLN
20.0000 mg | Freq: Two times a day (BID) | INTRAVENOUS | Status: DC
  Administered 2018-02-24 (×3): 20 mg via INTRAVENOUS
  Filled 2018-02-23 (×4): qty 50

## 2018-02-23 MED ORDER — ACETAMINOPHEN 325 MG PO TABS: 650.0000 mg | ORAL_TABLET | Freq: Four times a day (QID) | ORAL | Status: DC | PRN

## 2018-02-23 MED ORDER — DIPHENHYDRAMINE HCL 50 MG/ML IJ SOLN
25.0000 mg | Freq: Once | INTRAMUSCULAR | Status: AC
Start: 2018-02-23 — End: 2018-02-23
  Administered 2018-02-23: 25 mg via INTRAVENOUS
  Filled 2018-02-23: qty 1

## 2018-02-23 NOTE — H&P (Signed)
History and Physical    Nancy Gibbs:[id number] DOB: 12/05/1960 DOA: 02/23/2018  PCP: Shirlean Mylar, MD  Patient coming from: Home.  Chief Complaint: Facial swelling.  HPI: Nancy Gibbs is a 57 y.o. female with history of hypothyroidism who had low back surgery recently admitted recently on April 22 discharge following day after being observed for angioedema.  At the time the real cause for the angioedema was not known but was suspected to be either tramadol or Valium for patient's pain medications.  Patient stated prior to the last admission patient had some hives following which started developing angioedema.  At the time of patient's hives patient come to the ER and had taken some Benadryl despite which patient progressed to having angioedema and was admitted and was treated on prednisone.  Following discharge patient has been having swelling of the face off-and-on involving the off of the face or some part of the lips.  Had called up with her primary care physician and has appointment with allergy specialist next month.  Has not taken any new medications and stopped taking all the previous medications which mentioned earlier.  Denies any insect bite or trauma.  Denies any similar episodes in childhood or any family history.  Denies any new fragrances, soaps or detergent.  Last night patient developed worsening swelling of the face involving the whole face lips and difficulty breathing.  At this point patient called EMS.  ED Course: Following giving epi shot, steroids patient at this time is feeling better but still upper lip and face is mildly swollen.  Patient is able to complete sentences without difficulty and patient states initially she had wheezing which is completely resolved.  Patient admitted for further observation.  Review of Systems: As per HPI, rest all negative.   Past Medical History:  Diagnosis Date  . Anemia    "had it when I was pregnant" (02/10/2018)  . Anxiety     . Depression   . Dysrhythmia    tachycardia-takes propanolol PRN  . GERD (gastroesophageal reflux disease)   . Graves' disease    "I've had radioactive tx"  . Headache(784.0)    if meds not taken at specific time  . History of blood transfusion 11/2012; 01/2018   "related to both back surgeries" (02/10/2018)  . Hypertension   . Hypothyroidism   . Osteoarthritis    "hands; left hip; left knee" (02/10/2018)  . Panic attacks   . Pre-diabetes   . Suicide attempt (HCC) 1990s X3  . Tachycardia     Past Surgical History:  Procedure Laterality Date  . ABDOMINAL HYSTERECTOMY  ?1988  . BACK SURGERY    . CESAREAN SECTION  1988  . DILATION AND CURETTAGE OF UTERUS    . POSTERIOR LUMBAR FUSION  11/2012; 01/2018   "put in pins, screws, brackets; went higher up in my back"  . TONSILLECTOMY       reports that she has never smoked. She has never used smokeless tobacco. She reports that she drinks alcohol. She reports that she does not use drugs.  Allergies  Allergen Reactions  . Peanut-Containing Drug Products Anaphylaxis  . Pineapple Anaphylaxis  . Hydrocodone Hives and Itching    Scratching skin until bled.  . Oxycodone     Either versed or oxycodone caused hives and angioedema  . Versed [Midazolam]     Either versed or oxycodone caused hives and angioedema  . Other Rash    Some soaps and detergents, break out skin  rashes.  Pt will bring own soap.    Family History  Problem Relation Age of Onset  . Stroke Mother   . Peripheral vascular disease Mother   . Hyperlipidemia Father   . Cancer Father   . Diabetes Other     Prior to Admission medications   Medication Sig Start Date End Date Taking? Authorizing Provider  acetaminophen (TYLENOL) 325 MG tablet Take 2 tablets (650 mg total) by mouth every 6 (six) hours as needed for mild pain (or Fever >/= 101). Max of 4000 mg of tylenol/day 02/11/18  Yes Vann, Jessica U, DO  EPINEPHrine (EPIPEN 2-PAK) 0.3 mg/0.3 mL IJ SOAJ injection  Inject 0.3 mLs (0.3 mg total) into the muscle once as needed for up to 1 dose (allergic reaction). Patient taking differently: Inject 0.3 mg into the muscle once as needed (severe allergic reaction).  02/08/18  Yes Ward, Chase Picket, PA-C  famotidine (PEPCID) 20 MG tablet Take 1 tablet (20 mg total) by mouth 2 (two) times daily as needed (Itching, rash). 02/08/18  Yes Ward, Chase Picket, PA-C  hydrOXYzine (ATARAX/VISTARIL) 25 MG tablet Take 1 tablet (25 mg total) by mouth every 6 (six) hours as needed for itching. 02/08/18  Yes Ward, Chase Picket, PA-C  levothyroxine (SYNTHROID, LEVOTHROID) 88 MCG tablet Take 88 mcg by mouth daily before breakfast. 12/19/17  Yes [provider]  loratadine (CLARITIN) 10 MG tablet Take 1 tablet (10 mg total) by mouth daily. Patient taking differently: Take 10 mg by mouth daily as needed (allergic reaction).  02/12/18  Yes Vann, Jessica U, DO  polyethylene glycol (MIRALAX / GLYCOLAX) packet Take 17 g by mouth daily as needed (for constipation.).    Yes [provider]  Multiple Vitamin (MULTIVITAMINS PO) Take 1 Scoop by mouth daily. [employer] Powder    [provider]    Physical Exam: Vitals:   02/23/18 2215 02/23/18 2230 02/23/18 2245 02/23/18 2300  BP: 124/72 126/68 120/62 115/62  Pulse: 87 87 86 84  Resp:      Temp:      TempSrc:      SpO2: 95% 94% 97% 98%  Height:          Constitutional: Moderately built and nourished. Vitals:   02/23/18 2215 02/23/18 2230 02/23/18 2245 02/23/18 2300  BP: 124/72 126/68 120/62 115/62  Pulse: 87 87 86 84  Resp:      Temp:      TempSrc:      SpO2: 95% 94% 97% 98%  Height:       Eyes: Anicteric no pallor. ENMT: No discharge from the ears eyes nose or mouth.  Lips are swollen tongue is nonswollen uvula not clearly visible no stridor mild periorbital swelling. Neck: No stridor.  No neck rigidity.  No JVD appreciated. Respiratory: No rhonchi or  crepitations. Cardiovascular: S1-S2 heard no murmurs appreciated. Abdomen: Soft nontender bowel sounds present. Musculoskeletal: No edema.  No joint effusion. Skin: No rash.  Skin appears warm.  No hives. Neurologic: Alert awake oriented to time place and person.  Moves all extremities 5 x 5. Psychiatric: Appears normal.  Normal affect.   Labs on Admission: I have personally reviewed following labs and imaging studies  CBC: Recent Labs  Lab 02/23/18 1959  WBC 10.4  NEUTROABS 8.5*  HGB 12.7  HCT 39.3  MCV 83.4  PLT 394   Basic Metabolic Panel: Recent Labs  Lab 02/23/18 1959  NA 140  K 4.2  CL 107  CO2  25  GLUCOSE 121*  BUN <5*  CREATININE 0.95  CALCIUM 9.0   GFR: Estimated Creatinine Clearance: 75.6 mL/min (by C-G formula based on SCr of 0.95 mg/dL). Liver Function Tests: Recent Labs  Lab 02/23/18 1959  AST 21  ALT 23  ALKPHOS 99  BILITOT 0.3  PROT 7.3  ALBUMIN 3.4*   No results for input(s): LIPASE, AMYLASE in the last 168 hours. No results for input(s): AMMONIA in the last 168 hours. Coagulation Profile: Recent Labs  Lab 02/23/18 1959  INR 0.94   Cardiac Enzymes: No results for input(s): CKTOTAL, CKMB, CKMBINDEX, TROPONINI in the last 168 hours. BNP (last 3 results) No results for input(s): PROBNP in the last 8760 hours. HbA1C: No results for input(s): HGBA1C in the last 72 hours. CBG: No results for input(s): GLUCAP in the last 168 hours. Lipid Profile: No results for input(s): CHOL, HDL, LDLCALC, TRIG, CHOLHDL, LDLDIRECT in the last 72 hours. Thyroid Function Tests: Recent Labs    02/23/18 1959  TSH 0.178*  FREET4 0.96   Anemia Panel: No results for input(s): VITAMINB12, FOLATE, FERRITIN, TIBC, IRON, RETICCTPCT in the last 72 hours. Urine analysis: No results found for: COLORURINE, APPEARANCEUR, LABSPEC, PHURINE, GLUCOSEU, HGBUR, BILIRUBINUR, KETONESUR, PROTEINUR, UROBILINOGEN, NITRITE, LEUKOCYTESUR Sepsis  Labs: (procalcitonin:4,lacticidven:4) )No results found for this or any previous visit (from the past 240 hour(s)).   Radiological Exams on Admission: Dg Chest 2 View  Result Date: 02/23/2018 CLINICAL DATA:  Allergic reaction. Cough with shortness of breath for 2 days. Patient used EpiPen today. EXAM: CHEST - 2 VIEW COMPARISON:  02/09/2018 and 12/09/2012 radiographs. FINDINGS: Suboptimal inspiration on the lateral view. On the frontal examination, the lungs are well aerated and appear clear. The heart size and mediastinal contours are normal. There is no pleural effusion or pneumothorax. No acute osseous findings are seen. IMPRESSION: No active cardiopulmonary process. Suboptimal inspiration on the lateral view. Electronically Signed   By: Carey Bullocks M.D.   On: 02/23/2018 17:56     Assessment/Plan Principal Problem:   Allergic angioedema Active Problems:   Lumbar stenosis with neurogenic claudication    1. Angioedema -cause not clear.  Second episode within 2 weeks time.  Also has been having intermittent small episodes since discharge 2 weeks ago.  Patient has appointment with allergy specialist next month.  Complement levels have been sent for further assessment.  Levels are pending.  Since patient's symptoms are improving after epinephrine injection and also steroids I have placed patient on Solu-Medrol every 12 along with as needed Benadryl and scheduled dose of Pepcid.  Patient likely will need to be on steroid until seen by allergy specialist. 2. Hypothyroidism on Synthroid which patient has been taking long-term.  Since patient n.p.o. for now we will have dose IV.  TSH is low but will need to recheck with primary care. 3. Recent low back surgery.  Has no specific complaints at this time.   DVT prophylaxis: SCDs. Code Status: Full code. Family Communication: Family at the bedside. Disposition Plan: Home. Consults called: None. Admission status: Observation.   Eduard Clos MD [facility] Pager [phone].  If 7PM-7AM, please contact night-coverage [email or web address] Password [email or web address]  02/23/2018, 11:13 PM

## 2018-02-23 NOTE — ED Triage Notes (Signed)
Pt to ED via [facility] with c/o allergic reaction..  Pt used her Epi pen at approx 2:50 pm  Today.  EMS gave pt Benadryl  IV and Zofran  IV.  No resp. Distress present but pt does have swelling to lips

## 2018-02-23 NOTE — ED Provider Notes (Signed)
[facility] EMERGENCY DEPARTMENT Provider Note   CSN: [id number] Arrival date & time: 02/23/18  1559     History   Chief Complaint Chief Complaint  Patient presents with  . Allergic Reaction    HPI Nancy Gibbs is a 57 y.o. female.  HPI Patient has been having recurrent angioedema episodes for about the past 2 weeks.  As soon as things seem to improve we will swell and flare in another area.  He has been taking prednisone and Benadryl.  Symptoms were improving but have returned.  She reports she is got a large swelling of her upper lip.  She reports earlier her throat was feeling tight and constricted.  She did use her EpiPen.  She also took Benadryl.  She reports this is becoming very disruptive to her life as she cannot proceed with normal activities without having episodes of severe swelling some more typically in her face or mouth.  She does not take ACE inhibitor.  She is scheduled to see an allergist in June.  She reports she has her diet very modified and is eating very few varieties of foods. Past Medical History:  Diagnosis Date  . Anemia    "had it when I was pregnant" (02/10/2018)  . Anxiety   . Depression   . Dysrhythmia    tachycardia-takes propanolol PRN  . GERD (gastroesophageal reflux disease)   . Graves' disease    "I've had radioactive tx"  . Headache(784.0)    if meds not taken at specific time  . History of blood transfusion 11/2012; 01/2018   "related to both back surgeries" (02/10/2018)  . Hypertension   . Hypothyroidism   . Osteoarthritis    "hands; left hip; left knee" (02/10/2018)  . Panic attacks   . Pre-diabetes   . Suicide attempt (HCC) 1990s X3  . Tachycardia     Patient Active Problem List   Diagnosis Date Noted  . Angioedema 02/23/2018  . 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 Lumbar stenosis with neurogenic claudication 01/27/2018    Past Surgical History:  Procedure Laterality Date  . ABDOMINAL HYSTERECTOMY  ?1988  .  BACK SURGERY    . CESAREAN SECTION  1988  . DILATION AND CURETTAGE OF UTERUS    . POSTERIOR LUMBAR FUSION  11/2012; 01/2018   "put in pins, screws, brackets; went higher up in my back"  . TONSILLECTOMY       OB History   None      Home Medications    Prior to Admission medications   Medication Sig Start Date End Date Taking? Authorizing Provider  acetaminophen (TYLENOL) 325 MG tablet Take 2 tablets (650 mg total) by mouth every 6 (six) hours as needed for mild pain (or Fever >/= 101). Max of 4000 mg of tylenol/day 02/11/18  Yes Vann, Jessica U, DO  EPINEPHrine (EPIPEN 2-PAK) 0.3 mg/0.3 mL IJ SOAJ injection Inject 0.3 mLs (0.3 mg total) into the muscle once as needed for up to 1 dose (allergic reaction). Patient taking differently: Inject 0.3 mg into the muscle once as needed (severe allergic reaction).  02/08/18  Yes Ward, Chase Picket, PA-C  famotidine (PEPCID) 20 MG tablet Take 1 tablet (20 mg total) by mouth 2 (two) times daily as needed (Itching, rash). 02/08/18  Yes Ward, Chase Picket, PA-C  hydrOXYzine (ATARAX/VISTARIL) 25 MG tablet Take 1 tablet (25 mg total) by mouth every 6 (six) hours as needed for itching. 02/08/18  Yes Ward, Chase Picket, PA-C  levothyroxine (SYNTHROID,  LEVOTHROID) 88 MCG tablet Take 88 mcg by mouth daily before breakfast. 12/19/17  Yes [provider]  loratadine (CLARITIN) 10 MG tablet Take 1 tablet (10 mg total) by mouth daily. Patient taking differently: Take 10 mg by mouth daily as needed (allergic reaction).  02/12/18  Yes Vann, Jessica U, DO  polyethylene glycol (MIRALAX / GLYCOLAX) packet Take 17 g by mouth daily as needed (for constipation.).    Yes [provider]  Multiple Vitamin (MULTIVITAMINS PO) Take 1 Scoop by mouth daily. [employer] Powder    [provider]    Family History Family History  Problem Relation Age of Onset  . Stroke Mother   . Peripheral vascular disease Mother   . Hyperlipidemia  Father   . Cancer Father   . Diabetes Other     Social History Social History   Tobacco Use  . Smoking status: Never Smoker  . Smokeless tobacco: Never Used  Substance Use Topics  . Alcohol use: Yes    Comment: 02/10/2018 "nothing for months; don't usually drink unless it's a celabration"  . Drug use: No     Allergies   Peanut-containing drug products; Pineapple; Hydrocodone; Oxycodone; Versed [midazolam]; and Other   Review of Systems Review of Systems  10 Systems reviewed and are negative for acute change except as noted in the HPI.  Physical Exam Updated Vital Signs BP 115/62   Pulse 84   Temp 98.7 F (37.1 C) (Oral)   Resp 20   Ht  (1.651 m)   SpO2 98%   BMI 35.07 kg/m   Physical Exam  Constitutional: She is oriented to person, place, and time. She appears well-developed and well-nourished. No distress.  HENT:  Head: Normocephalic and atraumatic.  Moderately large swelling of the patient's upper lip diffusely.  Posterior airway is patent.  No significant tongue swelling.  Can depress the tongue and the back for visualization of the uvula which is noninflamed.  Eyes: EOM are normal.  Neck: Neck supple. No thyromegaly present.  Cardiovascular: Normal rate, regular rhythm, normal heart sounds and intact distal pulses.  Pulmonary/Chest: Effort normal.  Abdominal: Soft. She exhibits no distension. There is no tenderness. There is no guarding.  Musculoskeletal: Normal range of motion. She exhibits no edema or tenderness.  Neurological: She is alert and oriented to person, place, and time. No cranial nerve deficit. She exhibits normal muscle tone. Coordination normal.  Skin: Skin is warm and dry.  Psychiatric: She has a normal mood and affect.     ED Treatments / Results  Labs (all labs ordered are listed, but only abnormal results are displayed) Labs Reviewed  TSH - Abnormal; Notable for the following components:      Result Value   TSH 0.178 (*)     All other components within normal limits  SEDIMENTATION RATE - Abnormal; Notable for the following components:   Sed Rate 35 (*)    All other components within normal limits  COMPREHENSIVE METABOLIC PANEL - Abnormal; Notable for the following components:   Glucose, Bld 121 (*)    BUN <5 (*)    Albumin 3.4 (*)    All other components within normal limits  CBC WITH DIFFERENTIAL/PLATELET - Abnormal; Notable for the following components:   Neutro Abs 8.5 (*)    All other components within normal limits  T4, FREE  C-REACTIVE PROTEIN  PROTIME-INR  C1 ESTERASE INHIBITOR  C4 COMPLEMENT  COMPLEMENT COMPONENT C1Q  C3 COMPLEMENT  BASIC  METABOLIC PANEL  CBC    EKG None  Radiology Dg Chest 2 View  Result Date: 02/23/2018 CLINICAL DATA:  Allergic reaction. Cough with shortness of breath for 2 days. Patient used EpiPen today. EXAM: CHEST - 2 VIEW COMPARISON:  02/09/2018 and 12/09/2012 radiographs. FINDINGS: Suboptimal inspiration on the lateral view. On the frontal examination, the lungs are well aerated and appear clear. The heart size and mediastinal contours are normal. There is no pleural effusion or pneumothorax. No acute osseous findings are seen. IMPRESSION: No active cardiopulmonary process. Suboptimal inspiration on the lateral view. Electronically Signed   By: Carey Bullocks M.D.   On: 02/23/2018 17:56    Procedures Procedures (including critical care time)  Medications Ordered in ED Medications  acetaminophen (TYLENOL) tablet 650 mg (has no administration in time range)    Or  acetaminophen (TYLENOL) suppository 650 mg (has no administration in time range)  0.9 %  sodium chloride infusion (has no administration in time range)  methylPREDNISolone sodium succinate (SOLU-MEDROL) 40 mg/mL injection 40 mg (has no administration in time range)  famotidine (PEPCID) IVPB 20 mg premix (has no administration in time range)  diphenhydrAMINE (BENADRYL) injection 25 mg (has no  administration in time range)  methylPREDNISolone sodium succinate (SOLU-MEDROL) 125 mg/2 mL injection 125 mg (125 mg Intravenous Given 02/23/18 1747)  diphenhydrAMINE (BENADRYL) injection 25 mg (25 mg Intravenous Given 02/23/18 2300)     Initial Impression / Assessment and Plan / ED Course  I have reviewed the triage vital signs and the nursing notes.  Pertinent labs & imaging results that were available during my care of the patient were reviewed by me and considered in my medical decision making (see chart for details).  Clinical Course as of Feb 23 2349  Wynelle Link Feb 23, 2018  2158 His upper lip remains significantly swollen.  It has not changed since arrival.  Since treatment however, patient perceives that she is getting more swelling and tightness with itching in her eyes cheeks and forehead.  No improvement since arrival.   [MP]    Clinical Course User Index [MP] Arby Barrette, MD     Final Clinical Impressions(s) / ED Diagnoses   Final diagnoses:  Angioedema, initial encounter  Patient has been having problems with recurrent facial angioedema.  She did receive throat tightness and swelling prior to arrival.  She self administered an EpiPen.  She was given Solu-Medrol and Benadryl and observed.  Symptoms did not improve.  After period of 5 hours observation she continued to report she felt increased swelling itching in her periorbital area and cheeks.  At this time with worsening symptoms despite treatment, plan will be for admission for observation.  Sed rate mildly elevated, CRP negative.  Likely less probability of autoimmune etiology.  I have also added serology for complement.  She does not have family history of similar episodes primary hereditary angioedema little less likely.  ED Discharge Orders    None       Arby Barrette, MD 02/23/18 2352

## 2018-02-24 ENCOUNTER — Other Ambulatory Visit: Payer: Self-pay

## 2018-02-24 DIAGNOSIS — M48062 Spinal stenosis, lumbar region with neurogenic claudication: Secondary | ICD-10-CM

## 2018-02-24 LAB — BASIC METABOLIC PANEL
ANION GAP: 9 (ref 5–15)
BUN: 5 mg/dL — ABNORMAL LOW (ref 6–20)
CALCIUM: 9 mg/dL (ref 8.9–10.3)
CO2: 23 mmol/L (ref 22–32)
Chloride: 106 mmol/L (ref 101–111)
Creatinine, Ser: 0.93 mg/dL (ref 0.44–1.00)
GLUCOSE: 166 mg/dL — AB (ref 65–99)
Potassium: 4.4 mmol/L (ref 3.5–5.1)
Sodium: 138 mmol/L (ref 135–145)

## 2018-02-24 LAB — CBC
HCT: 39.2 % (ref 36.0–46.0)
HEMOGLOBIN: 12.6 g/dL (ref 12.0–15.0)
MCH: 26.9 pg (ref 26.0–34.0)
MCHC: 32.1 g/dL (ref 30.0–36.0)
MCV: 83.8 fL (ref 78.0–100.0)
Platelets: 415 10*3/uL — ABNORMAL HIGH (ref 150–400)
RBC: 4.68 MIL/uL (ref 3.87–5.11)
RDW: 14.6 % (ref 11.5–15.5)
WBC: 7.2 10*3/uL (ref 4.0–10.5)

## 2018-02-24 LAB — GLUCOSE, CAPILLARY: GLUCOSE-CAPILLARY: 142 mg/dL — AB (ref 65–99)

## 2018-02-24 MED ORDER — LEVOTHYROXINE SODIUM 88 MCG PO TABS
88.0000 ug | ORAL_TABLET | Freq: Every day | ORAL | Status: DC
  Administered 2018-02-25 – 2018-02-27 (×3): 88 ug via ORAL
  Filled 2018-02-24 (×3): qty 1

## 2018-02-24 MED ORDER — LEVOTHYROXINE SODIUM 100 MCG IV SOLR
44.0000 ug | Freq: Every day | INTRAVENOUS | Status: DC
  Filled 2018-02-24: qty 5

## 2018-02-24 MED ORDER — ENOXAPARIN SODIUM 40 MG/0.4ML ~~LOC~~ SOLN
40.0000 mg | SUBCUTANEOUS | Status: DC
  Administered 2018-02-24 – 2018-02-26 (×3): 40 mg via SUBCUTANEOUS
  Filled 2018-02-24 (×3): qty 0.4

## 2018-02-24 NOTE — ED Notes (Signed)
Mild angioedema noted

## 2018-02-24 NOTE — ED Notes (Signed)
RN text paged [facility]..    Patient states she receives Lovenox daily and does not have it ordered and would like to receive.   Jesse Sans RN

## 2018-02-24 NOTE — Progress Notes (Signed)
TEAM 1 - Stepdown/ICU TEAM  Nancy Gibbs  UEA:[id number] DOB: 14-Dec-1960 DOA: 02/23/2018 PCP: Shirlean Mylar, MD    Brief Narrative:  57 y.o. female with history of hypothyroidism who was admitted April 22 2-19 and discharged the following day after being observed for angioedema suspected to be due to either oxycodone or valium.  Following that discharge the patient continued to experience swelling of the face off-and-on involving the face or lips. She had not taken any new medications and stopped taking all the previous suspected medications. When she developed worsening swelling of the whole face w/ difficulty breathing she called EMS.  Subjective: Pt feels her facial and tongue swelling are not fully resolved but much improved.  She denies any trouble breathing, or wheezing.  She denies cp, n/v, or abdom pain.    Assessment & Plan:  Angioedema Idiopathic - second major episode in 2 weeks, w/ intermittent small episodes intervening - symptoms improved after epi injection and steroids   Hypothyroidism Cont home synthroid dose   Recent low back surgery Asymptomatic presently    DVT prophylaxis: lovenox  Code Status: FULL CODE Family Communication: spoke w/ mother and daughter at bedside  Disposition Plan:   Consultants:  none  Antimicrobials:  none   Objective: Blood pressure 132/80, pulse 99, temperature 97.7 F (36.5 C), temperature source Oral, resp. rate 10, height  (1.651 m), SpO2 98 %.  Intake/Output Summary (Last 24 hours) at 02/24/2018 1753 Last data filed at 02/24/2018 0046 Gross per 24 hour  Intake 50 ml  Output -  Net 50 ml   There were no vitals filed for this visit.  Examination: General: No acute respiratory distress Lungs: Clear to auscultation bilaterally without wheezes or crackles Cardiovascular: Regular rate and rhythm without murmur gallop or rub normal S1 and S2 Abdomen: Nontender, nondistended, soft, bowel sounds positive, no  rebound, no ascites, no appreciable mass Extremities: No significant cyanosis, clubbing, or edema bilateral lower extremities  CBC: Recent Labs  Lab 02/23/18 1959 02/24/18 0010  WBC 10.4 7.2  NEUTROABS 8.5*  --   HGB 12.7 12.6  HCT 39.3 39.2  MCV 83.4 83.8  PLT 394 415*   Basic Metabolic Panel: Recent Labs  Lab 02/23/18 1959 02/24/18 0010  NA 140 138  K 4.2 4.4  CL 107 106  CO2 25 23  GLUCOSE 121* 166*  BUN <5* 5*  CREATININE 0.95 0.93  CALCIUM 9.0 9.0   GFR: CrCl cannot be calculated (Unknown ideal weight.).  Liver Function Tests: Recent Labs  Lab 02/23/18 1959  AST 21  ALT 23  ALKPHOS 99  BILITOT 0.3  PROT 7.3  ALBUMIN 3.4*    Coagulation Profile: Recent Labs  Lab 02/23/18 1959  INR 0.94    HbA1C: Hgb A1c MFr Bld  Date/Time Value Ref Range Status  04/22/2010 08:54 AM (H) <5.7 % Final   6.0 (NOTE)                                                                       According to the ADA Clinical Practice Recommendations for 2011, when HbA1c is used as a screening test:   >=6.5%   Diagnostic of Diabetes Mellitus           (  if abnormal result  is confirmed)  5.7-6.4%   Increased risk of developing Diabetes Mellitus  References:Diagnosis and Classification of Diabetes Mellitus,Diabetes Care,2011,34(Suppl 1):S62-S69 and Standards of Medical Care in         Diabetes - 2011,Diabetes Care,2011,34  (Suppl 1):S11-S61.    CBG: Recent Labs  Lab 02/24/18 1518  GLUCAP 142*     Scheduled Meds: . levothyroxine  44 mcg Intravenous Daily  . methylPREDNISolone (SOLU-MEDROL) injection  40 mg Intravenous Q12H     LOS: 0 days   Lonia Blood, MD [facility] Office  [phone] Pager - Text Page per Amion as per below:  On-Call/Text Page:      [email or web address]      password [email or web address]  If 7PM-7AM, please contact night-coverage [email or web address] Password [email or web address] 02/24/2018, 5:53 PM

## 2018-02-24 NOTE — ED Notes (Signed)
Patient assisted to the bedside command  

## 2018-02-25 DIAGNOSIS — M19041 Primary osteoarthritis, right hand: Secondary | ICD-10-CM | POA: Diagnosis present

## 2018-02-25 DIAGNOSIS — Z809 Family history of malignant neoplasm, unspecified: Secondary | ICD-10-CM | POA: Diagnosis not present

## 2018-02-25 DIAGNOSIS — Z885 Allergy status to narcotic agent status: Secondary | ICD-10-CM | POA: Diagnosis not present

## 2018-02-25 DIAGNOSIS — T380X5A Adverse effect of glucocorticoids and synthetic analogues, initial encounter: Secondary | ICD-10-CM | POA: Diagnosis present

## 2018-02-25 DIAGNOSIS — T783XXD Angioneurotic edema, subsequent encounter: Secondary | ICD-10-CM | POA: Diagnosis not present

## 2018-02-25 DIAGNOSIS — Z91010 Allergy to peanuts: Secondary | ICD-10-CM | POA: Diagnosis not present

## 2018-02-25 DIAGNOSIS — R739 Hyperglycemia, unspecified: Secondary | ICD-10-CM | POA: Diagnosis not present

## 2018-02-25 DIAGNOSIS — R112 Nausea with vomiting, unspecified: Secondary | ICD-10-CM

## 2018-02-25 DIAGNOSIS — M48062 Spinal stenosis, lumbar region with neurogenic claudication: Secondary | ICD-10-CM | POA: Diagnosis not present

## 2018-02-25 DIAGNOSIS — R1310 Dysphagia, unspecified: Secondary | ICD-10-CM | POA: Diagnosis present

## 2018-02-25 DIAGNOSIS — T783XXA Angioneurotic edema, initial encounter: Secondary | ICD-10-CM | POA: Diagnosis present

## 2018-02-25 DIAGNOSIS — K219 Gastro-esophageal reflux disease without esophagitis: Secondary | ICD-10-CM | POA: Diagnosis present

## 2018-02-25 DIAGNOSIS — D72829 Elevated white blood cell count, unspecified: Secondary | ICD-10-CM | POA: Diagnosis present

## 2018-02-25 DIAGNOSIS — T17308A Unspecified foreign body in larynx causing other injury, initial encounter: Secondary | ICD-10-CM | POA: Diagnosis not present

## 2018-02-25 DIAGNOSIS — M1712 Unilateral primary osteoarthritis, left knee: Secondary | ICD-10-CM | POA: Diagnosis present

## 2018-02-25 DIAGNOSIS — Z8349 Family history of other endocrine, nutritional and metabolic diseases: Secondary | ICD-10-CM | POA: Diagnosis not present

## 2018-02-25 DIAGNOSIS — I10 Essential (primary) hypertension: Secondary | ICD-10-CM | POA: Diagnosis present

## 2018-02-25 DIAGNOSIS — Z823 Family history of stroke: Secondary | ICD-10-CM | POA: Diagnosis not present

## 2018-02-25 DIAGNOSIS — M19042 Primary osteoarthritis, left hand: Secondary | ICD-10-CM | POA: Diagnosis present

## 2018-02-25 DIAGNOSIS — Z90710 Acquired absence of both cervix and uterus: Secondary | ICD-10-CM | POA: Diagnosis not present

## 2018-02-25 DIAGNOSIS — Z91018 Allergy to other foods: Secondary | ICD-10-CM | POA: Diagnosis not present

## 2018-02-25 DIAGNOSIS — Z981 Arthrodesis status: Secondary | ICD-10-CM | POA: Diagnosis not present

## 2018-02-25 DIAGNOSIS — Z8249 Family history of ischemic heart disease and other diseases of the circulatory system: Secondary | ICD-10-CM | POA: Diagnosis not present

## 2018-02-25 DIAGNOSIS — M1612 Unilateral primary osteoarthritis, left hip: Secondary | ICD-10-CM | POA: Diagnosis present

## 2018-02-25 DIAGNOSIS — Z79890 Hormone replacement therapy (postmenopausal): Secondary | ICD-10-CM | POA: Diagnosis not present

## 2018-02-25 DIAGNOSIS — Z888 Allergy status to other drugs, medicaments and biological substances status: Secondary | ICD-10-CM | POA: Diagnosis not present

## 2018-02-25 DIAGNOSIS — E890 Postprocedural hypothyroidism: Secondary | ICD-10-CM | POA: Diagnosis present

## 2018-02-25 LAB — SEDIMENTATION RATE: SED RATE: 30 mm/h — AB (ref 0–22)

## 2018-02-25 LAB — CBC WITH DIFFERENTIAL/PLATELET
BASOS ABS: 0 10*3/uL (ref 0.0–0.1)
BASOS PCT: 0 %
EOS PCT: 0 %
Eosinophils Absolute: 0 10*3/uL (ref 0.0–0.7)
HCT: 37.7 % (ref 36.0–46.0)
Hemoglobin: 11.8 g/dL — ABNORMAL LOW (ref 12.0–15.0)
LYMPHS PCT: 7 %
Lymphs Abs: 1.3 10*3/uL (ref 0.7–4.0)
MCH: 26.2 pg (ref 26.0–34.0)
MCHC: 31.3 g/dL (ref 30.0–36.0)
MCV: 83.8 fL (ref 78.0–100.0)
Monocytes Absolute: 0.4 10*3/uL (ref 0.1–1.0)
Monocytes Relative: 2 %
NEUTROS ABS: 16.3 10*3/uL — AB (ref 1.7–7.7)
Neutrophils Relative %: 91 %
PLATELETS: 450 10*3/uL — AB (ref 150–400)
RBC: 4.5 MIL/uL (ref 3.87–5.11)
RDW: 14.8 % (ref 11.5–15.5)
WBC: 18 10*3/uL — AB (ref 4.0–10.5)

## 2018-02-25 LAB — COMPREHENSIVE METABOLIC PANEL
ALBUMIN: 3.1 g/dL — AB (ref 3.5–5.0)
ALT: 19 U/L (ref 14–54)
AST: 15 U/L (ref 15–41)
Alkaline Phosphatase: 90 U/L (ref 38–126)
Anion gap: 10 (ref 5–15)
BUN: 8 mg/dL (ref 6–20)
CHLORIDE: 107 mmol/L (ref 101–111)
CO2: 25 mmol/L (ref 22–32)
CREATININE: 0.86 mg/dL (ref 0.44–1.00)
Calcium: 9 mg/dL (ref 8.9–10.3)
GFR calc Af Amer: >60 mL/min (ref >60)
GLUCOSE: 138 mg/dL — AB (ref 65–99)
POTASSIUM: 4.3 mmol/L (ref 3.5–5.1)
Sodium: 142 mmol/L (ref 135–145)
Total Bilirubin: 0.6 mg/dL (ref 0.3–1.2)
Total Protein: 6.9 g/dL (ref 6.5–8.1)

## 2018-02-25 LAB — GLUCOSE, CAPILLARY
GLUCOSE-CAPILLARY: 133 mg/dL — AB (ref 65–99)
GLUCOSE-CAPILLARY: 146 mg/dL — AB (ref 65–99)
GLUCOSE-CAPILLARY: 163 mg/dL — AB (ref 65–99)

## 2018-02-25 LAB — C1 ESTERASE INHIBITOR: C1INH SerPl-mCnc: 42 mg/dL — ABNORMAL HIGH (ref 21–39)

## 2018-02-25 LAB — MRSA PCR SCREENING: MRSA BY PCR: NEGATIVE

## 2018-02-25 LAB — C3 COMPLEMENT: C3 Complement: 187 mg/dL — ABNORMAL HIGH (ref 82–167)

## 2018-02-25 LAB — C4 COMPLEMENT: Complement C4, Body Fluid: 42 mg/dL (ref 14–44)

## 2018-02-25 LAB — C-REACTIVE PROTEIN

## 2018-02-25 MED ORDER — FAMOTIDINE 20 MG PO TABS
20.0000 mg | ORAL_TABLET | Freq: Two times a day (BID) | ORAL | Status: DC
  Administered 2018-02-25: 20 mg via ORAL
  Filled 2018-02-25: qty 1

## 2018-02-25 MED ORDER — DIPHENHYDRAMINE HCL 50 MG/ML IJ SOLN
25.0000 mg | Freq: Four times a day (QID) | INTRAMUSCULAR | Status: DC
Start: 2018-02-25 — End: 2018-02-27
  Administered 2018-02-25 – 2018-02-27 (×9): 25 mg via INTRAVENOUS
  Filled 2018-02-25 (×9): qty 1

## 2018-02-25 MED ORDER — FAMOTIDINE IN NACL 20-0.9 MG/50ML-% IV SOLN: 20.0000 mg | Freq: Two times a day (BID) | INTRAVENOUS | Status: DC

## 2018-02-25 MED ORDER — METHYLPREDNISOLONE SODIUM SUCC 125 MG IJ SOLR
60.0000 mg | Freq: Three times a day (TID) | INTRAMUSCULAR | Status: DC
  Administered 2018-02-25 – 2018-02-26 (×3): 60 mg via INTRAVENOUS
  Filled 2018-02-25 (×3): qty 2

## 2018-02-25 MED ORDER — FAMOTIDINE IN NACL 20-0.9 MG/50ML-% IV SOLN
20.0000 mg | Freq: Two times a day (BID) | INTRAVENOUS | Status: DC
  Administered 2018-02-25 – 2018-02-27 (×4): 20 mg via INTRAVENOUS
  Filled 2018-02-25 (×4): qty 50

## 2018-02-25 NOTE — Progress Notes (Signed)
[place] TEAM 1 - Stepdown/ICU TEAM  PHUNG KOTAS  ENM:[id number] DOB: 12/22/1960 DOA: 02/23/2018 PCP: Maurice Small, MD    Brief Narrative:  57 y.o. female with history of hypothyroidism who was admitted April 22 2-19 and discharged the following day after being observed for angioedema suspected to be due to either oxycodone or valium.  Following that discharge the patient continued to experience swelling of the face off-and-on involving the face or lips. She had not taken any new medications and stopped taking all the previous suspected medications. When she developed worsening swelling of the whole face w/ difficulty breathing she called EMS.  Subjective: The patient feels she has suffered a significant setback today.  She feels the swelling in her face has slightly worsened today though she denies any trouble with breathing or wheezing.  She does however report severe return of her GI symptoms with dyspepsia and frequent diarrhea with unrelenting nausea.  She denies chest pain headache or shortness of breath.  Assessment & Plan:  Angioedema Idiopathic - second major episode in 2 weeks w/ intermittent less severe episodes intervening - symptoms improved after epi injection and steroids - C4 levels are not depressed, and C3 is mildly elevated - C1 esterase inhibitior is pending but presumably in absence of a low C4 this value will be normal - ESR is only modestly elevated, and CRP is normal - does NOT appear to be a bradykinin mediated process - maximize tx w/ H1 and H2 blockers - increase steroids - limit oral intake to liquids as I suspect she has GI involvement w/ her allergic process - will plan to send home on a very very slow steroid taper as well as antihistamines w/ outpt allergy f/u - I have explained to her the reason that allergy testing can not be done while she is on medical tx, and that she will have to establish a time to have this appropriately accomplished as an outpt by first  establishing care w/ an allergist - she is also informed that she should have an epipen on her person at all times w/o exception   Hypothyroidism Cont home synthroid dose   Hyperglycemia  Steroid induced - check A1c to r/o underlying DM   Recent low back surgery Asymptomatic presently    DVT prophylaxis: lovenox  Code Status: FULL CODE Family Communication: No family present at time of exam today Disposition Plan: Transition to MedSurg bed but patient will likely require 2-3 more days of hospital stay to assure her symptoms have resolved and she is able to tolerate oral intake  Consultants:  none  Antimicrobials:  none   Objective: Blood pressure 130/85, pulse 82, temperature 97.9 F (36.6 C), temperature source Oral, resp. rate 15, height 5' 4"  (1.626 m), weight 96.1 kg (211 lb 13.8 oz), SpO2 100 %.  Intake/Output Summary (Last 24 hours) at 02/25/2018 1005 Last data filed at 02/25/2018 0315 Gross per 24 hour  Intake 400 ml  Output 1200 ml  Net -800 ml   Filed Weights   02/24/18 1215  Weight: 96.1 kg (211 lb 13.8 oz)    Examination: General: No acute respiratory distress  emotionally labile  Lungs: Clear to auscultation bilaterally - no wheezing or stridor  Cardiovascular: RRR w/o M or rub  Abdomen: NT/ND, soft, bs+, no mass  Extremities: No edema bilateral lower extremities  CBC: Recent Labs  Lab 02/23/18 1959 02/24/18 0010 02/25/18 0448  WBC 10.4 7.2 18.0*  NEUTROABS 8.5*  --  16.3*  HGB 12.7 12.6 11.8*  HCT 39.3 39.2 37.7  MCV 83.4 83.8 83.8  PLT 394 415* 138*   Basic Metabolic Panel: Recent Labs  Lab 02/23/18 1959 02/24/18 0010 02/25/18 0448  NA 140 138 142  K 4.2 4.4 4.3  CL 107 106 107  CO2 25 23 25   GLUCOSE 121* 166* 138*  BUN <5* 5* 8  CREATININE 0.95 0.93 0.86  CALCIUM 9.0 9.0 9.0   GFR: Estimated Creatinine Clearance: 82.2 mL/min (by C-G formula based on SCr of 0.86 mg/dL).  Liver Function Tests: Recent Labs  Lab 02/23/18 1959  02/25/18 0448  AST 21 15  ALT 23 19  ALKPHOS 99 90  BILITOT 0.3 0.6  PROT 7.3 6.9  ALBUMIN 3.4* 3.1*    Coagulation Profile: Recent Labs  Lab 02/23/18 1959  INR 0.94    HbA1C: Hgb A1c MFr Bld  Date/Time Value Ref Range Status  04/22/2010 08:54 AM (H) <5.7 % Final   6.0 (NOTE)                                                                       According to the ADA Clinical Practice Recommendations for 2011, when HbA1c is used as a screening test:   >=6.5%   Diagnostic of Diabetes Mellitus           (if abnormal result  is confirmed)  5.7-6.4%   Increased risk of developing Diabetes Mellitus  References:Diagnosis and Classification of Diabetes Mellitus,Diabetes ITJL,5974,71(EZBMZ 1):S62-S69 and Standards of Medical Care in         Diabetes - 2011,Diabetes TAEW,2574,93  (Suppl 1):S11-S61.    CBG: Recent Labs  Lab 02/24/18 1518 02/25/18 0001  GLUCAP 142* 133*     Scheduled Meds: . enoxaparin (LOVENOX) injection  40 mg Subcutaneous Q24H  . levothyroxine  88 mcg Oral QAC breakfast  . methylPREDNISolone (SOLU-MEDROL) injection  40 mg Intravenous Q12H     LOS: 0 days   Cherene Altes, MD [facility] Office  [phone] Pager - Text Page per Shea Evans as per below:  On-Call/Text Page:      [email or web address]      password [email or web address]  If 7PM-7AM, please contact night-coverage [email or web address] Password [email or web address] 02/25/2018, 10:05 AM

## 2018-02-25 NOTE — Progress Notes (Deleted)
Discharged via WC to POV for discharge to home with family.

## 2018-02-25 NOTE — Progress Notes (Deleted)
Discharge instructions given and understanding verbalized.  Denies needs or questions.

## 2018-02-26 DIAGNOSIS — T783XXD Angioneurotic edema, subsequent encounter: Secondary | ICD-10-CM

## 2018-02-26 DIAGNOSIS — R1310 Dysphagia, unspecified: Secondary | ICD-10-CM

## 2018-02-26 LAB — CBC
HCT: 36.6 % (ref 36.0–46.0)
Hemoglobin: 11.8 g/dL — ABNORMAL LOW (ref 12.0–15.0)
MCH: 26.8 pg (ref 26.0–34.0)
MCHC: 32.2 g/dL (ref 30.0–36.0)
MCV: 83 fL (ref 78.0–100.0)
PLATELETS: 409 10*3/uL — AB (ref 150–400)
RBC: 4.41 MIL/uL (ref 3.87–5.11)
RDW: 14.5 % (ref 11.5–15.5)
WBC: 16.5 10*3/uL — ABNORMAL HIGH (ref 4.0–10.5)

## 2018-02-26 LAB — COMPREHENSIVE METABOLIC PANEL
ALBUMIN: 3.1 g/dL — AB (ref 3.5–5.0)
ALT: 18 U/L (ref 14–54)
ANION GAP: 8 (ref 5–15)
AST: 13 U/L — AB (ref 15–41)
Alkaline Phosphatase: 85 U/L (ref 38–126)
BILIRUBIN TOTAL: 0.5 mg/dL (ref 0.3–1.2)
BUN: 13 mg/dL (ref 6–20)
CHLORIDE: 107 mmol/L (ref 101–111)
CO2: 25 mmol/L (ref 22–32)
Calcium: 8.8 mg/dL — ABNORMAL LOW (ref 8.9–10.3)
Creatinine, Ser: 0.86 mg/dL (ref 0.44–1.00)
GFR calc Af Amer: >60 mL/min (ref >60)
GLUCOSE: 155 mg/dL — AB (ref 65–99)
Potassium: 4.1 mmol/L (ref 3.5–5.1)
Sodium: 140 mmol/L (ref 135–145)
TOTAL PROTEIN: 6.7 g/dL (ref 6.5–8.1)

## 2018-02-26 LAB — HEMOGLOBIN A1C
HEMOGLOBIN A1C: 6.1 % — AB (ref 4.8–5.6)
MEAN PLASMA GLUCOSE: 128.37 mg/dL

## 2018-02-26 LAB — GLUCOSE, CAPILLARY
Glucose-Capillary: 122 mg/dL — ABNORMAL HIGH (ref 65–99)
Glucose-Capillary: 128 mg/dL — ABNORMAL HIGH (ref 65–99)

## 2018-02-26 LAB — PHOSPHORUS: PHOSPHORUS: 3.4 mg/dL (ref 2.5–4.6)

## 2018-02-26 LAB — MAGNESIUM: MAGNESIUM: 2.4 mg/dL (ref 1.7–2.4)

## 2018-02-26 MED ORDER — METHYLPREDNISOLONE SODIUM SUCC 40 MG IJ SOLR
40.0000 mg | Freq: Three times a day (TID) | INTRAMUSCULAR | Status: DC
  Administered 2018-02-26 – 2018-02-27 (×4): 40 mg via INTRAVENOUS
  Filled 2018-02-26 (×4): qty 1

## 2018-02-26 NOTE — Evaluation (Signed)
Physical Therapy Evaluation Patient Details Name: Nancy Gibbs MRN: [id number] DOB: 1961/06/28 Today's Date: 02/26/2018   History of Present Illness  Pt is a 57 y/o female admitted secondary to allergic angioedema. Pt also with recent lumbar surgery. PMH includes lumbar surgery and pre-diabetes.   Clinical Impression  Pt admitted with above diagnosis. Pt currently with functional limitations due to the deficits listed below (see PT Problem List). Pt functioning at min guard. Spoke with RN and encouraged RN to utilize [facility] instead of [facility]wick. Pt will benefit from skilled PT to increase their independence and safety with mobility to allow discharge to the venue listed below.      Follow Up Recommendations Home health PT;Supervision for mobility/OOB    Equipment Recommendations  None recommended by PT    Recommendations for Other Services       Precautions / Restrictions Precautions Precautions: Back Precaution Booklet Issued: No Precaution Comments: pt able to recall precautions Required Braces or Orthoses: Spinal Brace Spinal Brace: Lumbar corset;Applied in sitting position Restrictions Weight Bearing Restrictions: No      Mobility  Bed Mobility Overal bed mobility: Needs Assistance Bed Mobility: Supine to Sit     Supine to sit: Modified independent (Device/Increase time)     General bed mobility comments: pt brought self to long sit despite education on utilizing log roll and pushing up with R UE/elbow  Transfers Overall transfer level: Needs assistance Equipment used: Rolling walker (2 wheeled) Transfers: Sit to/from Stand Sit to Stand: Supervision         General transfer comment: v/c's to push up from bed.   Ambulation/Gait Ambulation/Gait assistance: Min guard Ambulation Distance (Feet): 150 Feet Assistive device: Rolling walker (2 wheeled) Gait Pattern/deviations: Step-through pattern;Decreased stride length;Trunk flexed Gait velocity: Decreased  Gait  velocity interpretation: >2.62 ft/sec, indicative of community ambulatory General Gait Details: Slow, guarded gait. No LOB noted. Verbal cues for upright posture. Educated about generalized walking program to perform at home.   Stairs Stairs: Yes Stairs assistance: Min guard Stair Management: One rail Right;Sideways;Step to pattern Number of Stairs: 4 General stair comments: pt with good carry over from HHPT  Wheelchair Mobility    Modified Rankin (Stroke Patients Only)       Balance Overall balance assessment: Needs assistance Sitting-balance support: Feet supported Sitting balance-Leahy Scale: Good     Standing balance support: No upper extremity supported;During functional activity Standing balance-Leahy Scale: Good Standing balance comment: pt able to pull up underwear and adjust maxipad in standing without assist                             Pertinent Vitals/Pain Pain Assessment: 0-10 Pain Score: 3  Pain Location: back Pain Descriptors / Indicators: Sore Pain Intervention(s): Monitored during session    Home Living Family/patient expects to be discharged to:: Private residence Living Arrangements: Alone Available Help at Discharge: Family;Available PRN/intermittently(mother here for 2 days leaving 4/25 ) Type of Home: Apartment Home Access: Stairs to enter Entrance Stairs-Rails: Right Entrance Stairs-Number of Steps: 16 Home Layout: One level Home Equipment: Walker - 2 wheels;Shower seat;Bedside commode;Hand held shower head;Adaptive equipment Additional Comments: mother currently here but will be returning home this weekend    Prior Function Level of Independence: Independent with assistive device(s)         Comments: Reports she has been using RW since back surgery and pt's mother has provided supervision. Pt reports 1 fall at home. (when exiting the  bathroom)     Hand Dominance   Dominant Hand: Right    Extremity/Trunk Assessment    Upper Extremity Assessment Upper Extremity Assessment: Overall WFL for tasks assessed    Lower Extremity Assessment Lower Extremity Assessment: Generalized weakness    Cervical / Trunk Assessment Cervical / Trunk Assessment: Other exceptions Cervical / Trunk Exceptions: recent back surgery seen by OT on 01/29/18  Communication   Communication: No difficulties  Cognition Arousal/Alertness: Awake/alert Behavior During Therapy: WFL for tasks assessed/performed Overall Cognitive Status: Within Functional Limits for tasks assessed                                        General Comments      Exercises     Assessment/Plan    PT Assessment Patient needs continued PT services  PT Problem List Decreased balance;Decreased mobility;Decreased knowledge of use of DME;Decreased knowledge of precautions;Pain;Decreased strength       PT Treatment Interventions DME instruction;Gait training;Stair training;Functional mobility training;Therapeutic exercise;Therapeutic activities;Balance training;Neuromuscular re-education;Patient/family education    PT Goals (Current goals can be found in the Care Plan section)  Acute Rehab PT Goals Patient Stated Goal: to get better and to have help at home PT Goal Formulation: With patient Time For Goal Achievement: 03/12/18 Potential to Achieve Goals: Good    Frequency Min 3X/week   Barriers to discharge Decreased caregiver support mother leaving this weekend    Co-evaluation               AM-PAC PT "6 Clicks" Daily Activity  Outcome Measure Difficulty turning over in bed (including adjusting bedclothes, sheets and blankets)?: None Difficulty moving from lying on back to sitting on the side of the bed? : None Difficulty sitting down on and standing up from a chair with arms (e.g., wheelchair, bedside commode, etc,.)?: A Little Help needed moving to and from a bed to chair (including a wheelchair)?: A Little Help needed  walking in hospital room?: A Little Help needed climbing 3-5 steps with a railing? : A Little 6 Click Score: 20    End of Session Equipment Utilized During Treatment: Gait belt;Back brace Activity Tolerance: Patient tolerated treatment well Patient left: in chair;with call bell/phone within reach;with chair alarm set(pt sitting at EOB ) Nurse Communication: Mobility status PT Visit Diagnosis: Other abnormalities of gait and mobility (R26.89);Pain Pain - part of body: (back )    Time: 1610-9604 PT Time Calculation (min) (ACUTE ONLY): 26 min   Charges:   PT Evaluation $PT Eval Moderate Complexity: 1 Mod PT Treatments $Gait Training: 8-22 mins   PT G Codes:        Lewis Shock, PT, DPT Pager #: [phone] Office #: [phone]   Nancy Gibbs 02/26/2018, 11:50 AM

## 2018-02-26 NOTE — Evaluation (Signed)
Occupational Therapy Evaluation Patient Details Name: Nancy Gibbs MRN: [id number] DOB: 04-Aug-1961 Today's Date: 02/26/2018    History of Present Illness Pt is a 57 y/o female admitted secondary to allergic angioedema. Pt also with recent lumbar surgery. PMH includes lumbar surgery and pre-diabetes.    Clinical Impression   Pt demonstrated knowledge of back precautions during ADL and ADL transfers. Only needed cues for hand placement with sit to stand with walker. No acute OT needs.    Follow Up Recommendations  No OT follow up    Equipment Recommendations  None recommended by OT    Recommendations for Other Services       Precautions / Restrictions Precautions Precautions: Back Precaution Comments: pt able to recall precautions Required Braces or Orthoses: Spinal Brace Spinal Brace: Lumbar corset;Applied in sitting position Restrictions Weight Bearing Restrictions: No      Mobility Bed Mobility               General bed mobility comments: pt in chair  Transfers Overall transfer level: Needs assistance Equipment used: Rolling walker (2 wheeled) Transfers: Sit to/from Stand Sit to Stand: Supervision         General transfer comment: verbal cues to push from chair, 3 in 1    Balance Overall balance assessment: Needs assistance   Sitting balance-Leahy Scale: Good       Standing balance-Leahy Scale: Good Standing balance comment: cleaned off sink, washed hands, managed underwear without support                           ADL either performed or assessed with clinical judgement   ADL Overall ADL's : Modified independent                                             Vision Baseline Vision/History: No visual deficits       Perception     Praxis      Pertinent Vitals/Pain Pain Assessment: No/denies pain Pain Intervention(s): Monitored during session;Repositioned     Hand Dominance Right   Extremity/Trunk  Assessment Upper Extremity Assessment Upper Extremity Assessment: Overall WFL for tasks assessed   Lower Extremity Assessment Lower Extremity Assessment: Defer to PT evaluation   Cervical / Trunk Assessment Cervical / Trunk Exceptions: back surgery 1 month ago   Communication Communication Communication: No difficulties   Cognition Arousal/Alertness: Awake/alert Behavior During Therapy: WFL for tasks assessed/performed Overall Cognitive Status: Within Functional Limits for tasks assessed                                     General Comments       Exercises     Shoulder Instructions      Home Living Family/patient expects to be discharged to:: Private residence Living Arrangements: Alone Available Help at Discharge: Family;Available 24 hours/day(mom from [place] currently with pt) Type of Home: Apartment Home Access: Stairs to enter [employer] of Steps: 16 Entrance Stairs-Rails: Right Home Layout: One level     Bathroom Shower/Tub: Chief Strategy Officer: Standard     Home Equipment: Environmental consultant - 2 wheels;Shower seat;Bedside commode;Hand held shower head;Adaptive equipment Adaptive Equipment: Reacher;Sock aid Additional Comments: mother currently here but will be returning home this weekend  Prior Functioning/Environment Level of Independence: Independent with assistive device(s)        Comments: Reports she has been using RW since back surgery and pt's mother has provided supervision. Pt reports 1 fall at home. (when exiting the bathroom)        OT Problem List:        OT Treatment/Interventions:      OT Goals(Current goals can be found in the care plan section) Acute Rehab OT Goals Patient Stated Goal: to find out why she keeps having allergic reactions  OT Frequency:     Barriers to D/C:            Co-evaluation              AM-PAC PT "6 Clicks" Daily Activity     Outcome Measure Help from another  person eating meals?: None Help from another person taking care of personal grooming?: None Help from another person toileting, which includes using toliet, bedpan, or urinal?: None Help from another person bathing (including washing, rinsing, drying)?: None Help from another person to put on and taking off regular upper body clothing?: None Help from another person to put on and taking off regular lower body clothing?: None 6 Click Score: 24   End of Session Equipment Utilized During Treatment: Back brace;Rolling walker  Activity Tolerance: Patient tolerated treatment well Patient left: in chair;with call bell/phone within reach;with family/visitor present  OT Visit Diagnosis: Other abnormalities of gait and mobility (R26.89)                Time: 1610-9604 OT Time Calculation (min): 26 min Charges:  OT General Charges $OT Visit: 1 Visit OT Evaluation $OT Eval Low Complexity: 1 Low OT Treatments $Self Care/Home Management : 8-22 mins G-Codes:     Mar 02, 2018 Nancy Gibbs, OTR/L Pager: [phone]  Nancy Gibbs, Nancy Gibbs

## 2018-02-26 NOTE — Progress Notes (Addendum)
PROGRESS NOTE    Nancy Gibbs  WUJ:[id number] DOB: October 15, 1961 DOA: 02/23/2018 PCP: Shirlean Mylar, MD   Brief Narrative: Patient is a 57 year old female with past medical history of hypothyroidism, recurrent angioedema who presented to the emergency department with complaints of swelling of her face and lips.  Patient admitted for management of recurrent angioedema.  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  Second major episode in the last 2 weeks.  Symptoms improved after a repeat injection and steroids.  C4 levels normal, C3 mildly elevated, C1 esterase inhibitor mildly elevated. Continue H1 and H2 blocker along with steroids.  Facial and lip swelling much improved.  No respiratory problems. She will be discharged on S1 and S2 blockers with tapering dose of prednisone.  She needs to follow-up with her appointment with outpatient allergy specialist.  She should also carry an EpiPen with her all the time.  Dysphagia: Reports to be a chronic issue.  Will request for speech therapy evaluation. Patient says she cannot swallow so she is on IV meds.  Hyperglycemia: Likely steroid-induced.  Hemoglobin A1c of 6.1.  Leukocytosis: Most likely associated with the steroids.  We will continue to monitor.  Recent low back surgery:Stable.  Will hold oxycodone from suspicion of angioedema.  DVT prophylaxis: Lovenox Code Status: Full Family Communication: Friend/family member present at bedside Disposition Plan: Likely home in 1 to 2 days   Consultants: None  Procedures: None  Antimicrobials: None  Subjective: Patient seen and examined at bedside this morning.  Remains comfortable.  Swelling has much improved.  Respiratory status  stable.  Objective: Vitals:   02/25/18 1300 02/25/18 2204 02/26/18 0023 02/26/18 0715  BP: 115/69 (!) 134/94 127/69 (!) 134/91  Pulse: (!) 102 75 100 95  Resp: Temp:  97.9 F (36.6 C) 97.7 F (36.5 C) 97.7 F (36.5 C)  TempSrc:  Oral Oral Oral  SpO2: 99% 100% 100% 100%  Weight:      Height:        Intake/Output Summary (Last 24 hours) at 02/26/2018 1215 Last data filed at 02/26/2018 0820 Gross per 24 hour  Intake 120 ml  Output 2200 ml  Net -2080 ml   Filed Weights   02/24/18 1215  Weight: 96.1 kg (211 lb 13.8 oz)    Examination:  General exam: Appears calm and comfortable ,Not in distress,average built HEENT:PERRL,Oral mucosa moist, Ear/Nose normal on gross exam Respiratory system: Bilateral equal air entry, normal vesicular breath sounds, no wheezes or crackles  Cardiovascular system: S1 & S2 heard, RRR. No JVD, murmurs, rubs, gallops or clicks. No pedal edema. Gastrointestinal system: Abdomen is nondistended, soft and nontender. No organomegaly or masses felt. Normal bowel sounds heard. Central nervous system: Alert and oriented. No focal neurological deficits. Extremities: No edema, no clubbing ,no cyanosis, distal peripheral pulses palpable. Skin: No rashes, lesions or ulcers,no icterus ,no pallor MSK: Normal muscle bulk,tone ,power Psychiatry: Judgement and insight appear normal. Mood & affect appropriate.     Data Reviewed: I have personally reviewed following labs and imaging studies  CBC: Recent Labs  Lab 02/23/18 1959 02/24/18 0010 02/25/18 0448 02/26/18 0315  WBC 10.4 7.2 18.0* 16.5*  NEUTROABS 8.5*  --  16.3*  --   HGB 12.7 12.6 11.8*  11.8*  HCT 39.3 39.2 37.7 36.6  MCV 83.4 83.8 83.8 83.0  PLT 394 415* 450* 409*   Basic Metabolic Panel: Recent Labs  Lab 02/23/18 1959 02/24/18 0010 02/25/18 0448 02/26/18 0315  NA 140 138 142 140  K 4.2 4.4 4.3 4.1  CL 107 106 107 107  CO2 GLUCOSE 121* 166* 138* 155*  BUN <5* 5* 8  13  CREATININE 0.95 0.93 0.86 0.86  CALCIUM 9.0 9.0 9.0 8.8*  MG  --   --   --  2.4  PHOS  --   --   --  3.4   GFR: Estimated Creatinine Clearance: 82.2 mL/min (by C-G formula based on SCr of 0.86 mg/dL). Liver Function Tests: Recent Labs  Lab 02/23/18 1959 02/25/18 0448 02/26/18 0315  AST 21 15 13*  ALT ALKPHOS 99 90 85  BILITOT 0.3 0.6 0.5  PROT 7.3 6.9 6.7  ALBUMIN 3.4* 3.1* 3.1*   No results for input(s): LIPASE, AMYLASE in the last 168 hours. No results for input(s): AMMONIA in the last 168 hours. Coagulation Profile: Recent Labs  Lab 02/23/18 1959  INR 0.94   Cardiac Enzymes: No results for input(s): CKTOTAL, CKMB, CKMBINDEX, TROPONINI in the last 168 hours. BNP (last 3 results) No results for input(s): PROBNP in the last 8760 hours. HbA1C: Recent Labs    02/26/18 0315  HGBA1C 6.1*   CBG: Recent Labs  Lab 02/24/18 1518 02/25/18 0001 02/25/18 1146 02/25/18 2307 02/26/18 0623  GLUCAP 142* 133* 163* 146* 122*   Lipid Profile: No results for input(s): CHOL, HDL, LDLCALC, TRIG, CHOLHDL, LDLDIRECT in the last 72 hours. Thyroid Function Tests: Recent Labs    02/23/18 1959  TSH 0.178*  FREET4 0.96   Anemia Panel: No results for input(s): VITAMINB12, FOLATE, FERRITIN, TIBC, IRON, RETICCTPCT in the last 72 hours. Sepsis Labs: No results for input(s): PROCALCITON, LATICACIDVEN in the last 168 hours.  Recent Results (from the past 240 hour(s))  MRSA PCR Screening     Status: None   Collection Time: 02/24/18 10:12 PM  Result Value Ref Range Status   MRSA by PCR NEGATIVE NEGATIVE Final    Comment:        The GeneXpert MRSA Assay (FDA approved for NASAL specimens only), is one component of a comprehensive MRSA colonization surveillance program. It is not intended to diagnose MRSA infection nor to guide or monitor treatment for MRSA infections. Performed at [facility], [address] [place], [place] [address]           Radiology Studies: No results found.      Scheduled Meds: . diphenhydrAMINE  25 mg Intravenous Q6H  . enoxaparin (LOVENOX) injection  40 mg Subcutaneous Q24H  . levothyroxine  88 mcg Oral QAC breakfast  . methylPREDNISolone (SOLU-MEDROL) injection  40 mg Intravenous Q8H   Continuous Infusions: . famotidine (PEPCID) IV Stopped (02/26/18 1013)     LOS: 1 day    Time spent: 25 mins.More than 50% of that time was spent in counseling and/or coordination of care.      Burnadette Pop, MD [facility] Pager [phone]  If 7PM-7AM, please contact night-coverage [email or web address] Password [facility] 02/26/2018, 12:15 PM

## 2018-02-27 ENCOUNTER — Inpatient Hospital Stay (HOSPITAL_COMMUNITY): Payer: 59

## 2018-02-27 LAB — CBC WITH DIFFERENTIAL/PLATELET
Basophils Absolute: 0 10*3/uL (ref 0.0–0.1)
Basophils Relative: 0 %
EOS ABS: 0 10*3/uL (ref 0.0–0.7)
EOS PCT: 0 %
HCT: 37.7 % (ref 36.0–46.0)
Hemoglobin: 11.8 g/dL — ABNORMAL LOW (ref 12.0–15.0)
LYMPHS ABS: 1.1 10*3/uL (ref 0.7–4.0)
LYMPHS PCT: 7 %
MCH: 25.9 pg — AB (ref 26.0–34.0)
MCHC: 31.3 g/dL (ref 30.0–36.0)
MCV: 82.9 fL (ref 78.0–100.0)
MONOS PCT: 6 %
Monocytes Absolute: 0.9 10*3/uL (ref 0.1–1.0)
Neutro Abs: 14 10*3/uL — ABNORMAL HIGH (ref 1.7–7.7)
Neutrophils Relative %: 87 %
PLATELETS: 377 10*3/uL (ref 150–400)
RBC: 4.55 MIL/uL (ref 3.87–5.11)
RDW: 14.6 % (ref 11.5–15.5)
WBC: 16.1 10*3/uL — ABNORMAL HIGH (ref 4.0–10.5)

## 2018-02-27 LAB — GLUCOSE, CAPILLARY
GLUCOSE-CAPILLARY: 118 mg/dL — AB (ref 65–99)
GLUCOSE-CAPILLARY: 141 mg/dL — AB (ref 65–99)
Glucose-Capillary: 156 mg/dL — ABNORMAL HIGH (ref 65–99)

## 2018-02-27 MED ORDER — LORATADINE 10 MG PO TABS: 10.0000 mg | ORAL_TABLET | Freq: Every day | ORAL | 0 refills | Status: AC

## 2018-02-27 MED ORDER — DIPHENHYDRAMINE HCL 25 MG PO CAPS: 25.0000 mg | ORAL_CAPSULE | Freq: Three times a day (TID) | ORAL | Status: DC | PRN

## 2018-02-27 MED ORDER — PREDNISONE 10 MG PO TABS: ORAL_TABLET | ORAL | 0 refills | Status: AC

## 2018-02-27 MED ORDER — FAMOTIDINE 20 MG PO TABS: 20.0000 mg | ORAL_TABLET | Freq: Two times a day (BID) | ORAL | 0 refills | Status: AC | PRN

## 2018-02-27 MED ORDER — DIPHENHYDRAMINE HCL 25 MG PO CAPS: 25.0000 mg | ORAL_CAPSULE | Freq: Three times a day (TID) | ORAL | 0 refills | Status: AC | PRN

## 2018-02-27 MED ORDER — FAMOTIDINE 20 MG PO TABS: 20.0000 mg | ORAL_TABLET | Freq: Two times a day (BID) | ORAL | Status: DC

## 2018-02-27 MED ORDER — PREDNISONE 20 MG PO TABS: 40.0000 mg | ORAL_TABLET | Freq: Every day | ORAL | Status: DC

## 2018-02-27 NOTE — Evaluation (Signed)
Clinical/Bedside Swallow Evaluation Patient Details  Name: Nancy Gibbs MRN: [id number] Date of Birth: 1961/03/08  Today's Date: 02/27/2018 Time: SLP Start Time (ACUTE ONLY): 4098 SLP Stop Time (ACUTE ONLY): 0921 SLP Time Calculation (min) (ACUTE ONLY): 28 min  Past Medical History:  Past Medical History:  Diagnosis Date  . Anemia    "had it when I was pregnant" (02/10/2018)  . Anxiety   . Depression   . Dysrhythmia    tachycardia-takes propanolol PRN  . GERD (gastroesophageal reflux disease)   . Graves' disease    "I've had radioactive tx"  . Headache(784.0)    if meds not taken at specific time  . History of blood transfusion 11/2012; 01/2018   "related to both back surgeries" (02/10/2018)  . Hypertension   . Hypothyroidism   . Osteoarthritis    "hands; left hip; left knee" (02/10/2018)  . Panic attacks   . Pre-diabetes   . Suicide attempt (HCC) 1990s X3  . Tachycardia    Past Surgical History:  Past Surgical History:  Procedure Laterality Date  . ABDOMINAL HYSTERECTOMY  ?1988  . BACK SURGERY    . CESAREAN SECTION  1988  . DILATION AND CURETTAGE OF UTERUS    . POSTERIOR LUMBAR FUSION  11/2012; 01/2018   "put in pins, screws, brackets; went higher up in my back"  . TONSILLECTOMY     HPI:  Pt is a 57 y/o female admitted secondary to allergic angioedema. SLP ordered due to chronic dysphagia with pt report of being unable to swallow. Pt also with recent lumbar surgery. PMH includes lumbar surgery, pre-diabetes, graves' disease, GERD, anxiety   Assessment / Plan / Recommendation Clinical Impression  Pt's oropharyngeal swallow appears functional with all consistencies tested. She has no overt signs of aspiration and her CXR is clear. Her symptoms appear more GI based: regurgitation of foods, solids getting "stuck", frequent eructation. She comments that she has only been eating cream of wheat at home for fear of regurgitation even with other purees. Suspect an exacerbation  of more chronic issues given her h/o GERD also coupled perhaps with some anxiety about eating. From an oroparyngeal standpoint I do not see a reason to restrict her diet, but she is not comfortable advancing her solids at this time. SLP reviewed esophageal strategies and precautions. No further acute SLP needs identified, but would recommend considering esophageal w/u and slow diet advancement as pt can tolerate.  SLP Visit Diagnosis: Dysphagia, unspecified (R13.10)    Aspiration Risk  Mild aspiration risk    Diet Recommendation Regular;Thin liquid(advance as tolerated)   Liquid Administration via: Cup;Straw Medication Administration: Whole meds with puree Supervision: Patient able to self feed;Intermittent supervision to cue for compensatory strategies Compensations: Slow rate;Small sips/bites;Follow solids with liquid Postural Changes: Seated upright at 90 degrees;Remain upright for at least 30 minutes after po intake    Other  Recommendations Recommended Consults: Consider GI evaluation;Consider esophageal assessment Oral Care Recommendations: Oral care BID   Follow up Recommendations None      Frequency and Duration            Prognosis Prognosis for Safe Diet Advancement: Good      Swallow Study   General HPI: Pt is a 57 y/o female admitted secondary to allergic angioedema. SLP ordered due to chronic dysphagia with pt report of being unable to swallow. Pt also with recent lumbar surgery. PMH includes lumbar surgery, pre-diabetes, graves' disease, GERD, anxiety Type of Study: Bedside Swallow Evaluation Previous Swallow Assessment: none  in chart Diet Prior to this Study: Thin liquids(full liquid) Temperature Spikes Noted: No Respiratory Status: Room air History of Recent Intubation: No Behavior/Cognition: Alert;Cooperative Oral Cavity Assessment: Within Functional Limits Oral Care Completed by SLP: No Oral Cavity - Dentition: Adequate natural dentition Vision: Functional  for self-feeding Self-Feeding Abilities: Able to feed self Patient Positioning: Upright in bed Baseline Vocal Quality: Normal Volitional Cough: Strong Volitional Swallow: Able to elicit    Oral/Motor/Sensory Function Overall Oral Motor/Sensory Function: Within functional limits   Ice Chips Ice chips: Not tested   Thin Liquid Thin Liquid: Within functional limits Presentation: Cup;Self Fed;Straw    Nectar Thick Nectar Thick Liquid: Not tested   Honey Thick Honey Thick Liquid: Not tested   Puree Puree: Within functional limits Presentation: Self Fed;Spoon   Solid   GO   Solid: Within functional limits Presentation: Self Nancy Gibbs 02/27/2018,9:34 AM  Maxcine Ham, M.A. CCC-SLP [phone]

## 2018-02-27 NOTE — Progress Notes (Signed)
Patient discharging home. Patient given discharge instructions. Patient educated on medications and making follow up appts with PCP and allergy clinic. Patient signed discharge AVS and patient verbalized understanding. Patient discharged home with mom.

## 2018-02-27 NOTE — Progress Notes (Signed)
Physical Therapy Treatment Patient Details Name: Nancy Gibbs MRN: [id number] DOB: 06/04/1961 Today's Date: 02/27/2018    History of Present Illness Pt is a 57 y/o female admitted 02/23/18 secondary to allergic angioedema. Pt also with recent lumbar surgery. PMH includes lumbar surgery, pre-diabetes.    PT Comments    Pt progressing well with mobility. Able to amb 450' with RW and ascend/descend 12 steps with supervision for safety. Good recall of back precautions, but requires intermittent cues to keep from twisting with activity. Indep with brace application. Pt currently in processing of packing up home for move, increased time spent discussing ways to work on packing with assist while maintaining back precautions.   Follow Up Recommendations  Home health PT;Supervision for mobility/OOB     Equipment Recommendations  None recommended by PT    Recommendations for Other Services       Precautions / Restrictions Precautions Precautions: Back Precaution Comments: pt able to recall precautions Required Braces or Orthoses: Spinal Brace Spinal Brace: Lumbar corset;Applied in sitting position Restrictions Weight Bearing Restrictions: No    Mobility  Bed Mobility Overal bed mobility: Modified Independent Bed Mobility: Supine to Sit              Transfers Overall transfer level: Modified independent Equipment used: Rolling walker (2 wheeled);None Transfers: Sit to/from Stand              Ambulation/Gait Ambulation/Gait assistance: Supervision Ambulation Distance (Feet): 450 Feet Assistive device: Rolling walker (2 wheeled) Gait Pattern/deviations: Step-through pattern;Decreased stride length Gait velocity: Decreased   General Gait Details: Slow, steady amb with RW and supervision. Intermittent cues to maintain upright posture; cues to stop completely when lifting UE off RW while walking.    Stairs   Stairs assistance: Supervision Stair Management: One rail  Right;Sideways;Step to pattern Number of Stairs: 12 General stair comments: Ascend/descended 6 steps x2 (limited by IV length) with BUE support on R-rail; supervision for safety. Pt with good technique   Wheelchair Mobility    Modified Rankin (Stroke Patients Only)       Balance Overall balance assessment: Needs assistance Sitting-balance support: Feet supported Sitting balance-Leahy Scale: Good Sitting balance - Comments: Indep donning brace sitting EOB   Standing balance support: No upper extremity supported;During functional activity Standing balance-Leahy Scale: Good                              Cognition Arousal/Alertness: Awake/alert Behavior During Therapy: WFL for tasks assessed/performed Overall Cognitive Status: Within Functional Limits for tasks assessed                                        Exercises      General Comments        Pertinent Vitals/Pain Pain Assessment: Faces Faces Pain Scale: Hurts a little bit Pain Location: back Pain Descriptors / Indicators: Sore Pain Intervention(s): Monitored during session    Home Living                      Prior Function            PT Goals (current goals can now be found in the care plan section) Acute Rehab PT Goals Patient Stated Goal: to find out why she keeps having allergic reactions PT Goal Formulation: With patient Time For Goal Achievement: 03/12/18  Potential to Achieve Goals: Good Progress towards PT goals: Progressing toward goals    Frequency    Min 3X/week      PT Plan Current plan remains appropriate    Co-evaluation              AM-PAC PT "6 Clicks" Daily Activity  Outcome Measure  Difficulty turning over in bed (including adjusting bedclothes, sheets and blankets)?: None Difficulty moving from lying on back to sitting on the side of the bed? : None Difficulty sitting down on and standing up from a chair with arms (e.g., wheelchair,  bedside commode, etc,.)?: None Help needed moving to and from a bed to chair (including a wheelchair)?: None Help needed walking in hospital room?: A Little Help needed climbing 3-5 steps with a railing? : A Little 6 Click Score: 22    End of Session Equipment Utilized During Treatment: Gait belt;Back brace Activity Tolerance: Patient tolerated treatment well Patient left: in chair;with call bell/phone within reach Nurse Communication: Mobility status PT Visit Diagnosis: Other abnormalities of gait and mobility (R26.89);Pain     Time: 1610-9604 PT Time Calculation (min) (ACUTE ONLY): 32 min  Charges:  $Gait Training: 8-22 mins $Therapeutic Activity: 8-22 mins                    G Codes:      Ina Homes, PT, DPT Acute Rehab Services  Pager: [phone]  Malachy Chamber 02/27/2018, 11:46 AM

## 2018-02-27 NOTE — Discharge Summary (Addendum)
Physician Discharge Summary  Nancy Gibbs UJW:[id number] DOB: 1961-02-16 DOA: 02/23/2018  PCP: Shirlean Mylar, MD  Admit date: 02/23/2018 Discharge date: 02/27/2018  Admitted From: Home Disposition:  Home  Discharge Condition:Stable CODE STATUS:FULL Diet recommendation: Regular  Brief/Interim Summary:  Patient is a 57 year old female with past medical history of hypothyroidism, recurrent angioedema who presented to the emergency department with complaints of swelling of her face and lips.  Patient admitted for management of recurrent angioedema.  Patient was treated IV steroids, H1 and H2 blockers.  Angioedema gradually improved. Currently her overall status is stable.  She will be discharged on tapering dose of prednisone.  She has an appointment with allergy specialist as an outpatient.  She needs to continue loratadine at home.  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  Second major episode in the last 2 weeks.  Symptoms improved with H1 and H2  and steroids.  C4 levels normal, C3 mildly elevated, C1 esterase inhibitor mildly elevated. Facial and lip swelling much improved.  No respiratory problems. She will be discharged on S1 and S2 blockers with tapering dose of prednisone.  She needs to follow-up with her appointment with outpatient allergy specialist.  She should also carry an EpiPen with her all the time.  Dysphagia: Reports to be a chronic issue.   Speech therapy evaluated her and she is able to swallow normally at this time.Suspected esophageal etiology so DG esophagram ordered.  Esophagram did not show any anatomic abnormalities .  Hyperglycemia: Likely steroid-induced.  Hemoglobin A1c of 6.1.  Leukocytosis: Most likely associated with the steroids.  Check CBC as an outpatient during follow-up with the PMD.  Recent low back surgery:Stable.  Will hold oxycodone from suspicion of  angioedema.   Discharge Diagnoses:  Principal Problem:   Allergic angioedema Active Problems:   Lumbar stenosis with neurogenic claudication   Angioedema   Idiopathic angioedema   Hyperglycemia   Intractable vomiting with nausea   Dysphagia    Discharge Instructions  Discharge Instructions    Diet general   Complete by:  As directed    Discharge instructions   Complete by:  As directed    1) Please follow up with your PCP in a week. 2) Follow up with Allergy clinic as an outpatient.Name and number of the provider has been attached. 3) Take prescribed medications as instructed.   Increase activity slowly   Complete by:  As directed      Allergies as of 02/27/2018      Reactions   Peanut-containing Drug Products Anaphylaxis   Pineapple Anaphylaxis   Hydrocodone Hives, Itching   Scratching skin until bled.   Oxycodone    Either versed or oxycodone caused hives and angioedema   Versed [midazolam]    Either versed or oxycodone caused hives and angioedema   Other Rash   Some soaps and detergents, break out skin rashes.  Pt will bring own soap.      Medication List    STOP taking these medications   hydrOXYzine 25 MG tablet Commonly known as:  ATARAX/VISTARIL     TAKE these medications   acetaminophen 325 MG tablet Commonly known as:  TYLENOL Take 2 tablets (650 mg total) by mouth every 6 (six) hours as needed for mild pain (or Fever >/= 101). Max of 4000 mg of tylenol/day   diphenhydrAMINE 25 mg capsule Commonly known as:  BENADRYL Take 1 capsule (25 mg total) by mouth every 8 (eight)  hours as needed for itching or allergies.   EPINEPHrine 0.3 mg/0.3 mL Soaj injection Commonly known as:  EPIPEN 2-PAK Inject 0.3 mLs (0.3 mg total) into the muscle once as needed for up to 1 dose (allergic reaction). What changed:  reasons to take this   famotidine 20 MG tablet Commonly known as:  PEPCID Take 1 tablet (20 mg total) by mouth 2 (two) times daily as needed  (Itching, rash).   levothyroxine 88 MCG tablet Commonly known as:  SYNTHROID, LEVOTHROID Take 88 mcg by mouth daily before breakfast.   loratadine 10 MG tablet Commonly known as:  CLARITIN Take 1 tablet (10 mg total) by mouth daily. What changed:    when to take this  reasons to take this   MULTIVITAMINS PO Take 1 Scoop by mouth daily. [employer] Powder   polyethylene glycol packet Commonly known as:  MIRALAX / GLYCOLAX Take 17 g by mouth daily as needed (for constipation.).   predniSONE 10 MG tablet Commonly known as:  DELTASONE Take 40 g daily for 3 days then 30 mg daily for 3 days then 20 mg daily for 3 days then 10 mg daily for 3 days then stop.      Follow-up Information    Sidney Ace, MD Follow up on 03/24/2018.   Specialty:  Allergy Why:  new patient follow up, patient can not be tested til 30 days after she has had an reaction Contact information: [address] [place] [address] [phone]        Shirlean Mylar, MD. Schedule an appointment as soon as possible for a visit in 1 week(s).   Specialty:  Family Medicine Contact information: [address] [place] [address] [phone]          Allergies  Allergen Reactions  . Peanut-Containing Drug Products Anaphylaxis  . Pineapple Anaphylaxis  . Hydrocodone Hives and Itching    Scratching skin until bled.  . Oxycodone     Either versed or oxycodone caused hives and angioedema  . Versed [Midazolam]     Either versed or oxycodone caused hives and angioedema  . Other Rash    Some soaps and detergents, break out skin rashes.  Pt will bring own soap.    Consultations:  None   Procedures/Studies: Dg Chest 2 View  Result Date: 02/23/2018 CLINICAL DATA:  Allergic reaction. Cough with shortness of breath for 2 days. Patient used EpiPen today. EXAM: CHEST - 2 VIEW COMPARISON:  02/09/2018 and 12/09/2012 radiographs. FINDINGS: Suboptimal  inspiration on the lateral view. On the frontal examination, the lungs are well aerated and appear clear. The heart size and mediastinal contours are normal. There is no pleural effusion or pneumothorax. No acute osseous findings are seen. IMPRESSION: No active cardiopulmonary process. Suboptimal inspiration on the lateral view. Electronically Signed   By: Carey Bullocks M.D.   On: 02/23/2018 17:56   Dg Esophagus  Result Date: 02/27/2018 CLINICAL DATA:  Gagging, dry heaving after eating solid food. Per patient, onset after thoracic spine surgery. EXAM: ESOPHOGRAM/BARIUM SWALLOW TECHNIQUE: Combined double contrast and single contrast examination performed using effervescent crystals, thick barium liquid, and thin barium liquid. FLUOROSCOPY TIME:  Fluoroscopy Time:  1 minutes and 12 seconds COMPARISON:  None. FINDINGS: Patient swallowed thin barium solution without difficulty. Silent laryngeal penetration was observed with rapid drinking. No filling defects or mucosal irregularity of the esophagus. However hesitant contractions and delayed clearing of the contrast from the esophagus was seen.  No functional obstruction at the GE junction. The 13 mm barium tablet passed to the stomach without delay. The study was prematurely terminated, due to patient severe nausea. IMPRESSION: Limited study, which was modified and shortened due to patient's severe nausea. Normal anatomic appearance of the esophagus with tertiary contractions and delayed clearing of the contrast. Silent laryngeal penetration was observed with rapid drinking of thin barium. Formal swallowing function study may be considered for further evaluation. Electronically Signed   By: Ted Mcalpine M.D.   On: 02/27/2018 15:38   Dg Chest Portable 1 View  Result Date: 02/09/2018 CLINICAL DATA:  Cough EXAM: PORTABLE CHEST 1 VIEW COMPARISON:  12/09/2012 FINDINGS: Cardiac shadow is within normal limits. Mild vascular congestion is noted with mild  bibasilar atelectatic change. No bony abnormality is seen. IMPRESSION: Mild atelectatic changes and vascular congestion. Electronically Signed   By: Alcide Clever M.D.   On: 02/09/2018 21:04      Subjective: Patient seen and examined the bedside this morning.  Remains comfortable.  Lip and face  swelling have resolved.  She is able to swallow the pills and food by mouth.  Discharge Exam: Vitals:   02/27/18 0038 02/27/18 0700  BP: 122/77 133/88  Pulse: 65 74  Resp: 18   Temp: (!) 97.5 F (36.4 C) 98 F (36.7 C)  SpO2: 99% 100%   Vitals:   02/26/18 0733 02/26/18 1630 02/27/18 0038 02/27/18 0700  BP: (!) 134/91 119/75 122/77 133/88  Pulse: 75 72 65 74  Resp: Temp: 97.6 F (36.4 C) 98 F (36.7 C) (!) 97.5 F (36.4 C) 98 F (36.7 C)  TempSrc: Oral Oral Oral Oral  SpO2: 96% 99% 99% 100%  Weight:      Height:        General: Pt is alert, awake, not in acute distress Cardiovascular: RRR, S1/S2 +, no rubs, no gallops Respiratory: CTA bilaterally, no wheezing, no rhonchi Abdominal: Soft, NT, ND, bowel sounds + Extremities: no edema, no cyanosis    The results of significant diagnostics from this hospitalization (including imaging, microbiology, ancillary and laboratory) are listed below for reference.     Microbiology: Recent Results (from the past 240 hour(s))  MRSA PCR Screening     Status: None   Collection Time: 02/24/18 10:12 PM  Result Value Ref Range Status   MRSA by PCR NEGATIVE NEGATIVE Final    Comment:        The GeneXpert MRSA Assay (FDA approved for NASAL specimens only), is one component of a comprehensive MRSA colonization surveillance program. It is not intended to diagnose MRSA infection nor to guide or monitor treatment for MRSA infections. Performed at [facility], [address], [place], [place] [address]      Labs: BNP (last 3 results) No results for input(s): BNP in the last 8760 hours. Basic Metabolic Panel: Recent  Labs  Lab 02/23/18 1959 02/24/18 0010 02/25/18 0448 02/26/18 0315  NA 140 138 142 140  K 4.2 4.4 4.3 4.1  CL 107 106 107 107  CO2 GLUCOSE 121* 166* 138* 155*  BUN <5* 5* 8 13  CREATININE 0.95 0.93 0.86 0.86  CALCIUM 9.0 9.0 9.0 8.8*  MG  --   --   --  2.4  PHOS  --   --   --  3.4   Liver Function Tests: Recent Labs  Lab 02/23/18 1959 02/25/18 0448 02/26/18 0315  AST 21 15 13*  ALT ALKPHOS 99 90 85  BILITOT 0.3 0.6 0.5  PROT 7.3 6.9 6.7  ALBUMIN 3.4* 3.1* 3.1*   No results for input(s): LIPASE, AMYLASE in the last 168 hours. No results for input(s): AMMONIA in the last 168 hours. CBC: Recent Labs  Lab 02/23/18 1959 02/24/18 0010 02/25/18 0448 02/26/18 0315 02/27/18 0256  WBC 10.4 7.2 18.0* 16.5* 16.1*  NEUTROABS 8.5*  --  16.3*  --  14.0*  HGB 12.7 12.6 11.8* 11.8* 11.8*  HCT 39.3 39.2 37.7 36.6 37.7  MCV 83.4 83.8 83.8 83.0 82.9  PLT 394 415* 450* 409* 377   Cardiac Enzymes: No results for input(s): CKTOTAL, CKMB, CKMBINDEX, TROPONINI in the last 168 hours. BNP: Invalid input(s): POCBNP CBG: Recent Labs  Lab 02/26/18 0623 02/26/18 1236 02/27/18 0035 02/27/18 0739 02/27/18 1409  GLUCAP 122* 128* 156* 118* 141*   D-Dimer No results for input(s): DDIMER in the last 72 hours. Hgb A1c Recent Labs    02/26/18 0315  HGBA1C 6.1*   Lipid Profile No results for input(s): CHOL, HDL, LDLCALC, TRIG, CHOLHDL, LDLDIRECT in the last 72 hours. Thyroid function studies No results for input(s): TSH, T4TOTAL, T3FREE, THYROIDAB in the last 72 hours.  Invalid input(s): FREET3 Anemia work up No results for input(s): VITAMINB12, FOLATE, FERRITIN, TIBC, IRON, RETICCTPCT in the last 72 hours. Urinalysis No results found for: COLORURINE, APPEARANCEUR, LABSPEC, PHURINE, GLUCOSEU, HGBUR, BILIRUBINUR, KETONESUR, PROTEINUR, UROBILINOGEN, NITRITE, LEUKOCYTESUR Sepsis Labs Invalid input(s): PROCALCITONIN,  WBC,  LACTICIDVEN Microbiology Recent  Results (from the past 240 hour(s))  MRSA PCR Screening     Status: None   Collection Time: 02/24/18 10:12 PM  Result Value Ref Range Status   MRSA by PCR NEGATIVE NEGATIVE Final    Comment:        The GeneXpert MRSA Assay (FDA approved for NASAL specimens only), is one component of a comprehensive MRSA colonization surveillance program. It is not intended to diagnose MRSA infection nor to guide or monitor treatment for MRSA infections. Performed at [facility], [address], [place], [place] [address]      Time coordinating discharge: 35 minutes  SIGNED:   Burnadette Pop, MD  [facility] 02/27/2018, 3:51 PM Pager [phone]  If 7PM-7AM, please contact night-coverage [email or web address] Password [email or web address]

## 2018-02-28 DIAGNOSIS — F419 Anxiety disorder, unspecified: Secondary | ICD-10-CM | POA: Diagnosis not present

## 2018-02-28 DIAGNOSIS — R7303 Prediabetes: Secondary | ICD-10-CM | POA: Diagnosis not present

## 2018-02-28 DIAGNOSIS — Z7952 Long term (current) use of systemic steroids: Secondary | ICD-10-CM | POA: Diagnosis not present

## 2018-02-28 DIAGNOSIS — E039 Hypothyroidism, unspecified: Secondary | ICD-10-CM | POA: Diagnosis not present

## 2018-02-28 DIAGNOSIS — M48062 Spinal stenosis, lumbar region with neurogenic claudication: Secondary | ICD-10-CM | POA: Diagnosis not present

## 2018-02-28 DIAGNOSIS — R000 Tachycardia, unspecified: Secondary | ICD-10-CM | POA: Diagnosis not present

## 2018-03-03 MED FILL — LEVOTHYROXINE 88 MCG TABLET: 88 | 30 days supply | Qty: 30 | Fill #2

## 2018-03-04 DIAGNOSIS — M48062 Spinal stenosis, lumbar region with neurogenic claudication: Secondary | ICD-10-CM | POA: Diagnosis not present

## 2018-03-04 DIAGNOSIS — R000 Tachycardia, unspecified: Secondary | ICD-10-CM | POA: Diagnosis not present

## 2018-03-04 DIAGNOSIS — R7303 Prediabetes: Secondary | ICD-10-CM | POA: Diagnosis not present

## 2018-03-04 DIAGNOSIS — E039 Hypothyroidism, unspecified: Secondary | ICD-10-CM | POA: Diagnosis not present

## 2018-03-04 DIAGNOSIS — Z7952 Long term (current) use of systemic steroids: Secondary | ICD-10-CM | POA: Diagnosis not present

## 2018-03-04 DIAGNOSIS — F419 Anxiety disorder, unspecified: Secondary | ICD-10-CM | POA: Diagnosis not present

## 2018-03-04 LAB — COMPLEMENT COMPONENT C1Q: C1q Complement Protein CC1Q: 11.4 mg/dL — ABNORMAL LOW (ref 11.8–24.4)

## 2018-03-05 DIAGNOSIS — Z6834 Body mass index (BMI) 34.0-34.9, adult: Secondary | ICD-10-CM | POA: Diagnosis not present

## 2018-03-05 DIAGNOSIS — T7840XA Allergy, unspecified, initial encounter: Secondary | ICD-10-CM | POA: Diagnosis not present

## 2018-03-05 DIAGNOSIS — T783XXD Angioneurotic edema, subsequent encounter: Secondary | ICD-10-CM | POA: Diagnosis not present

## 2018-03-05 DIAGNOSIS — R030 Elevated blood-pressure reading, without diagnosis of hypertension: Secondary | ICD-10-CM | POA: Diagnosis not present

## 2018-03-05 DIAGNOSIS — M48062 Spinal stenosis, lumbar region with neurogenic claudication: Secondary | ICD-10-CM | POA: Diagnosis not present

## 2018-03-05 DIAGNOSIS — Z0000 Encounter for general adult medical examination without abnormal findings: Secondary | ICD-10-CM | POA: Diagnosis not present

## 2018-03-05 MED FILL — EPINEPHRINE 0.3 MG AUTO-INJ: 0.3 | 30 days supply | Qty: 2 | Fill #0

## 2018-03-07 DIAGNOSIS — Z1231 Encounter for screening mammogram for malignant neoplasm of breast: Secondary | ICD-10-CM | POA: Diagnosis not present

## 2018-03-07 MED FILL — predniSONE 10 MG TABS: 10 | 6 days supply | Qty: 21 | Fill #0

## 2018-03-12 DIAGNOSIS — Z7952 Long term (current) use of systemic steroids: Secondary | ICD-10-CM | POA: Diagnosis not present

## 2018-03-12 DIAGNOSIS — F419 Anxiety disorder, unspecified: Secondary | ICD-10-CM | POA: Diagnosis not present

## 2018-03-12 DIAGNOSIS — R000 Tachycardia, unspecified: Secondary | ICD-10-CM | POA: Diagnosis not present

## 2018-03-12 DIAGNOSIS — R7303 Prediabetes: Secondary | ICD-10-CM | POA: Diagnosis not present

## 2018-03-12 DIAGNOSIS — M48062 Spinal stenosis, lumbar region with neurogenic claudication: Secondary | ICD-10-CM | POA: Diagnosis not present

## 2018-03-12 DIAGNOSIS — E039 Hypothyroidism, unspecified: Secondary | ICD-10-CM | POA: Diagnosis not present

## 2018-03-12 MED FILL — predniSONE 10 MG TABS: 10 | 6 days supply | Qty: 21 | Fill #0

## 2018-03-13 DIAGNOSIS — Z7952 Long term (current) use of systemic steroids: Secondary | ICD-10-CM | POA: Diagnosis not present

## 2018-03-13 DIAGNOSIS — M48062 Spinal stenosis, lumbar region with neurogenic claudication: Secondary | ICD-10-CM | POA: Diagnosis not present

## 2018-03-13 DIAGNOSIS — E039 Hypothyroidism, unspecified: Secondary | ICD-10-CM | POA: Diagnosis not present

## 2018-03-13 DIAGNOSIS — R000 Tachycardia, unspecified: Secondary | ICD-10-CM | POA: Diagnosis not present

## 2018-03-13 DIAGNOSIS — F419 Anxiety disorder, unspecified: Secondary | ICD-10-CM | POA: Diagnosis not present

## 2018-03-13 DIAGNOSIS — R7303 Prediabetes: Secondary | ICD-10-CM | POA: Diagnosis not present

## 2018-03-13 DIAGNOSIS — H524 Presbyopia: Secondary | ICD-10-CM | POA: Diagnosis not present

## 2018-03-18 MED FILL — predniSONE 10 MG TABS: 10 | 6 days supply | Qty: 21 | Fill #0

## 2018-03-20 ENCOUNTER — Other Ambulatory Visit: Payer: Self-pay | Admitting: Gastroenterology

## 2018-03-20 DIAGNOSIS — R1310 Dysphagia, unspecified: Secondary | ICD-10-CM | POA: Diagnosis not present

## 2018-03-24 ENCOUNTER — Other Ambulatory Visit: Payer: 59

## 2018-03-24 DIAGNOSIS — T783XXA Angioneurotic edema, initial encounter: Secondary | ICD-10-CM | POA: Diagnosis not present

## 2018-03-24 DIAGNOSIS — R05 Cough: Secondary | ICD-10-CM | POA: Diagnosis not present

## 2018-03-24 DIAGNOSIS — J3089 Other allergic rhinitis: Secondary | ICD-10-CM | POA: Diagnosis not present

## 2018-03-24 DIAGNOSIS — L501 Idiopathic urticaria: Secondary | ICD-10-CM | POA: Diagnosis not present

## 2018-03-24 MED FILL — MONTELUKAST SOD 10 MG TAB: 10 | 30 days supply | Qty: 30 | Fill #0

## 2018-03-25 ENCOUNTER — Ambulatory Visit
Admission: RE | Admit: 2018-03-25 | Discharge: 2018-03-25 | Disposition: A | Discharge status: Home / Self Care | Payer: 59 | Source: Ambulatory Visit | Attending: Gastroenterology | Admitting: Gastroenterology

## 2018-03-25 DIAGNOSIS — K219 Gastro-esophageal reflux disease without esophagitis: Secondary | ICD-10-CM | POA: Diagnosis not present

## 2018-03-25 DIAGNOSIS — T783XXA Angioneurotic edema, initial encounter: Secondary | ICD-10-CM | POA: Diagnosis not present

## 2018-03-25 DIAGNOSIS — R1310 Dysphagia, unspecified: Secondary | ICD-10-CM | POA: Diagnosis not present

## 2018-03-27 DIAGNOSIS — Z6835 Body mass index (BMI) 35.0-35.9, adult: Secondary | ICD-10-CM | POA: Diagnosis not present

## 2018-03-27 DIAGNOSIS — M48062 Spinal stenosis, lumbar region with neurogenic claudication: Secondary | ICD-10-CM | POA: Diagnosis not present

## 2018-04-02 MED FILL — predniSONE 10 MG TABS: 10 | 6 days supply | Qty: 21 | Fill #0

## 2018-04-07 MED FILL — LEVOTHYROXINE 88 MCG TABLET: 88 | 30 days supply | Qty: 30 | Fill #0

## 2018-04-16 MED FILL — OMEPRAZOLE DR 40 MG CAPSULE: 40 | 90 days supply | Qty: 90 | Fill #0

## 2018-04-21 DIAGNOSIS — Z0000 Encounter for general adult medical examination without abnormal findings: Secondary | ICD-10-CM | POA: Diagnosis not present

## 2018-04-21 DIAGNOSIS — F331 Major depressive disorder, recurrent, moderate: Secondary | ICD-10-CM | POA: Diagnosis not present

## 2018-04-21 DIAGNOSIS — E785 Hyperlipidemia, unspecified: Secondary | ICD-10-CM | POA: Diagnosis not present

## 2018-04-21 DIAGNOSIS — E890 Postprocedural hypothyroidism: Secondary | ICD-10-CM | POA: Diagnosis not present

## 2018-04-21 DIAGNOSIS — I10 Essential (primary) hypertension: Secondary | ICD-10-CM | POA: Diagnosis not present

## 2018-04-21 DIAGNOSIS — Z111 Encounter for screening for respiratory tuberculosis: Secondary | ICD-10-CM | POA: Diagnosis not present

## 2018-04-21 MED FILL — buPROPion HCL 100 MG TABS: 100 | 30 days supply | Qty: 60 | Fill #0

## 2018-04-30 DIAGNOSIS — I10 Essential (primary) hypertension: Secondary | ICD-10-CM | POA: Diagnosis not present

## 2018-04-30 DIAGNOSIS — M48062 Spinal stenosis, lumbar region with neurogenic claudication: Secondary | ICD-10-CM | POA: Diagnosis not present

## 2018-04-30 DIAGNOSIS — Z6836 Body mass index (BMI) 36.0-36.9, adult: Secondary | ICD-10-CM | POA: Diagnosis not present

## 2018-04-30 DIAGNOSIS — K219 Gastro-esophageal reflux disease without esophagitis: Secondary | ICD-10-CM | POA: Diagnosis not present

## 2018-05-05 MED FILL — LEVOTHYROXINE 88 MCG TABLET: 88 | 30 days supply | Qty: 30 | Fill #1

## 2018-05-05 MED FILL — MONTELUKAST SOD 10 MG TAB: 10 | 30 days supply | Qty: 30 | Fill #1

## 2019-03-15 IMAGING — RF DG LUMBAR SPINE 2-3V
1 series · 2 of 2 positions shown · non-contrast
Comparison: Plain films lumbar spine from [HOSPITAL] [REDACTED] 12/19/2017.

CLINICAL DATA: Intraoperative imaging for patient undergoing L3-4
laminectomy and fusion. History of prior L4-5 fusion.

EXAM:
DG C-ARM 61-120 MIN; LUMBAR SPINE - 2-3 VIEW

[Series 1: run · 2 of 2 slices shown]
[im 1/2]
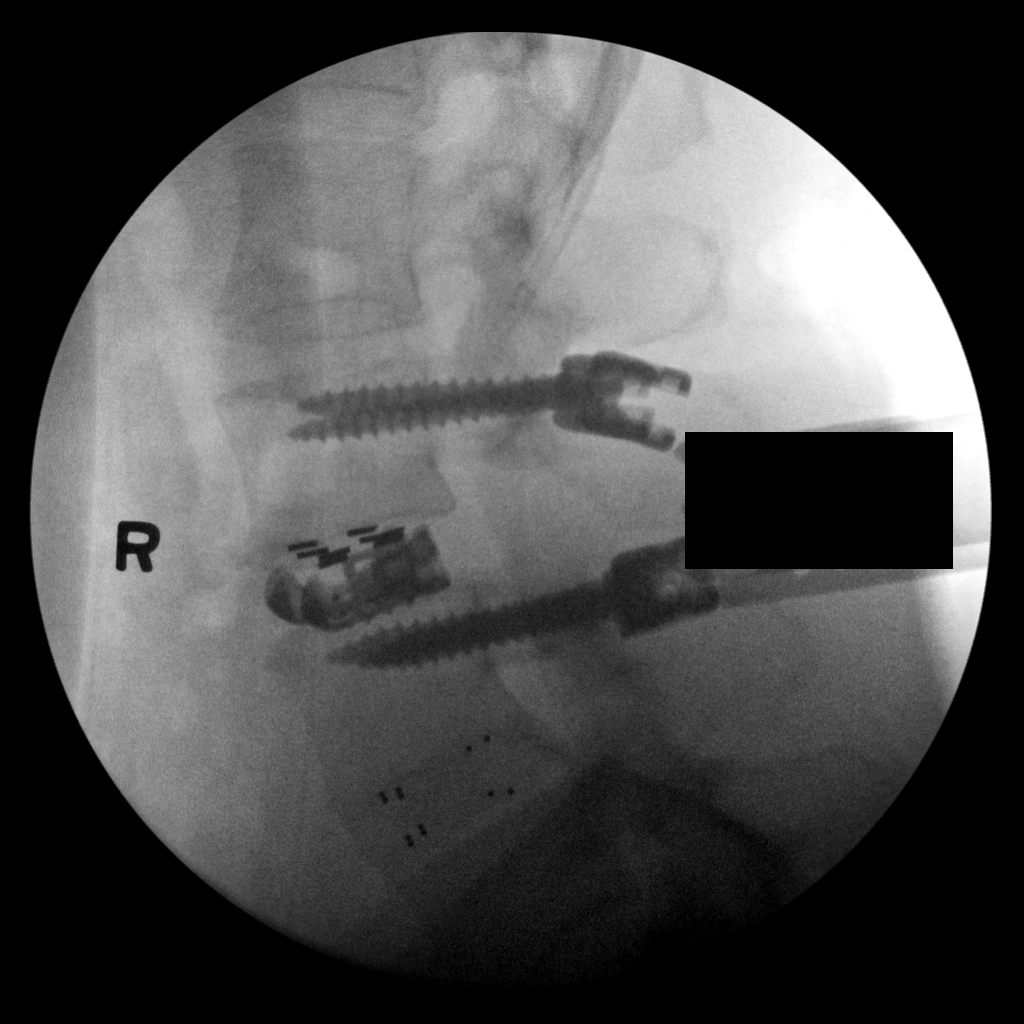
[im 2/2]
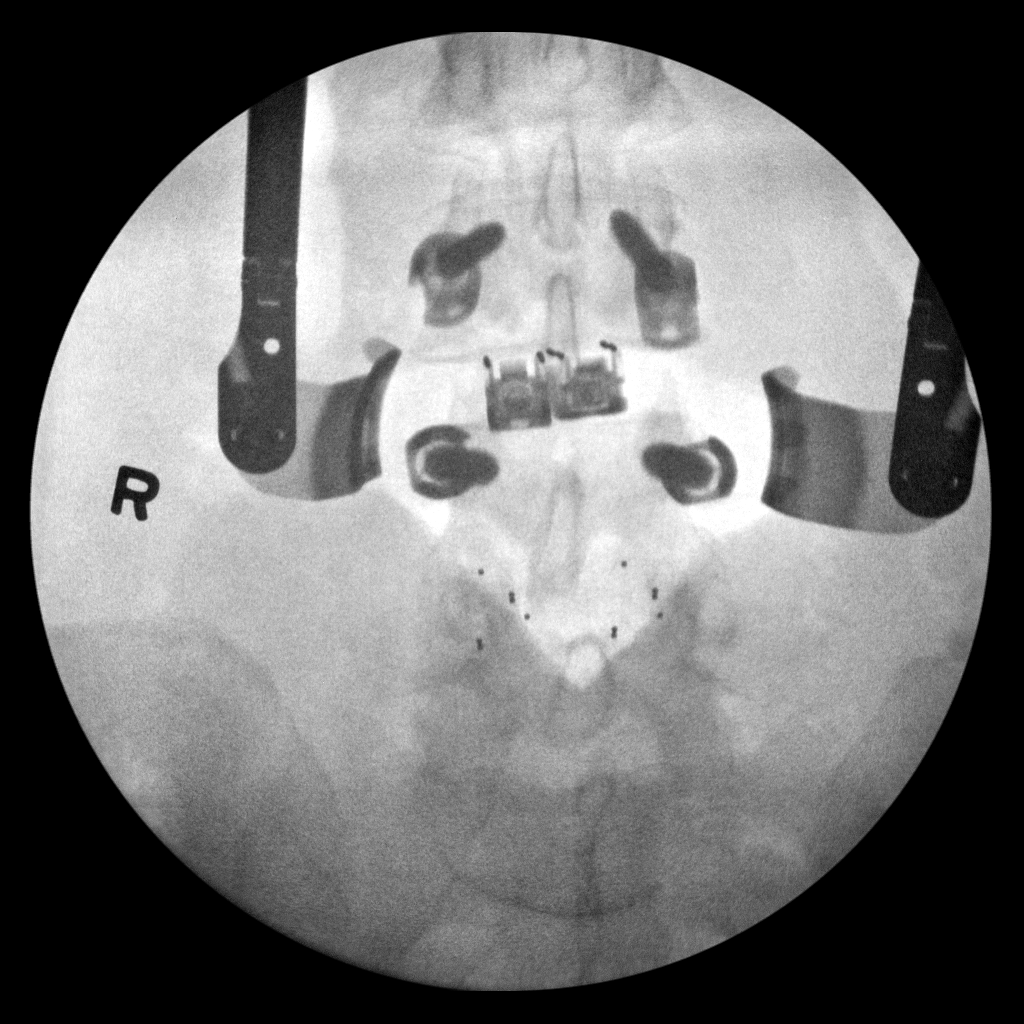

[2 of 2 positions shown; findings below may reference images not displayed]

FINDINGS: Pedicle screws in L5 seen on the prior exam have been removed. No
retained hardware. Solid L4-5 fusion is seen. Pedicle screws are in
place at L4 and new screws are seen at L3. Interbody spacer is
identified. No acute abnormality.
IMPRESSION: Intraoperative imaging for L3-4 fusion and removal of L5 pedicle
screws.

## 2019-03-28 IMAGING — DX DG CHEST 1V PORT
1 series · 1 of 1 positions shown · non-contrast
Comparison: 12/09/2012

CLINICAL DATA: Cough

EXAM:
PORTABLE CHEST 1 VIEW

[chest ap]
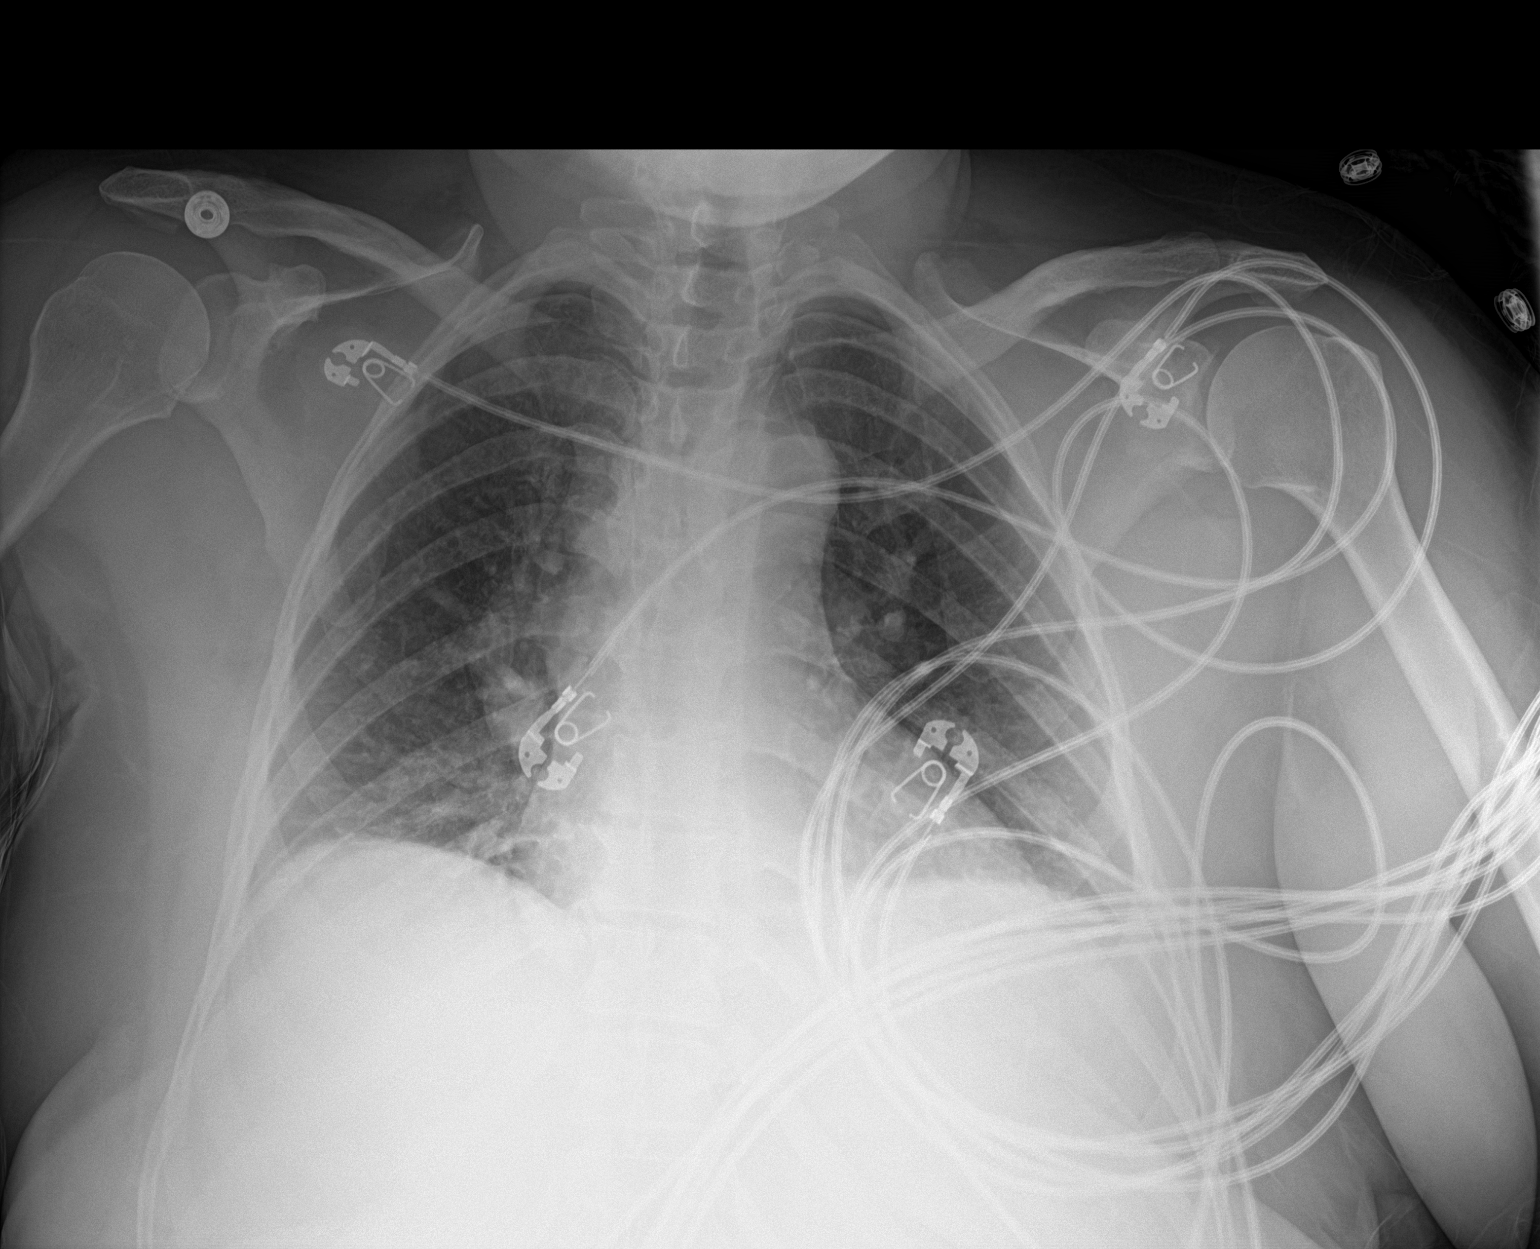

[1 of 1 positions shown; findings below may reference images not displayed]

FINDINGS: Cardiac shadow is within normal limits. Mild vascular congestion is
noted with mild bibasilar atelectatic change. No bony abnormality is
seen.
IMPRESSION: Mild atelectatic changes and vascular congestion.

## 2019-04-15 IMAGING — RF DG ESOPHAGUS
8 of 10 series · 13 of 18 positions shown · non-contrast
Comparison: None.

CLINICAL DATA: Gagging, dry heaving after eating solid food. Per
patient, onset after thoracic spine surgery.

EXAM:
ESOPHOGRAM/BARIUM SWALLOW
TECHNIQUE: Combined double contrast and single contrast examination performed
using effervescent crystals, thick barium liquid, and thin barium
liquid.
FLUOROSCOPY TIME:  Fluoroscopy Time:  1 minutes and 12 seconds

[Series 1: cp_standard · 0.18mm/px · 1 of 1 slices shown (1 of 7)]
[im 1/1]
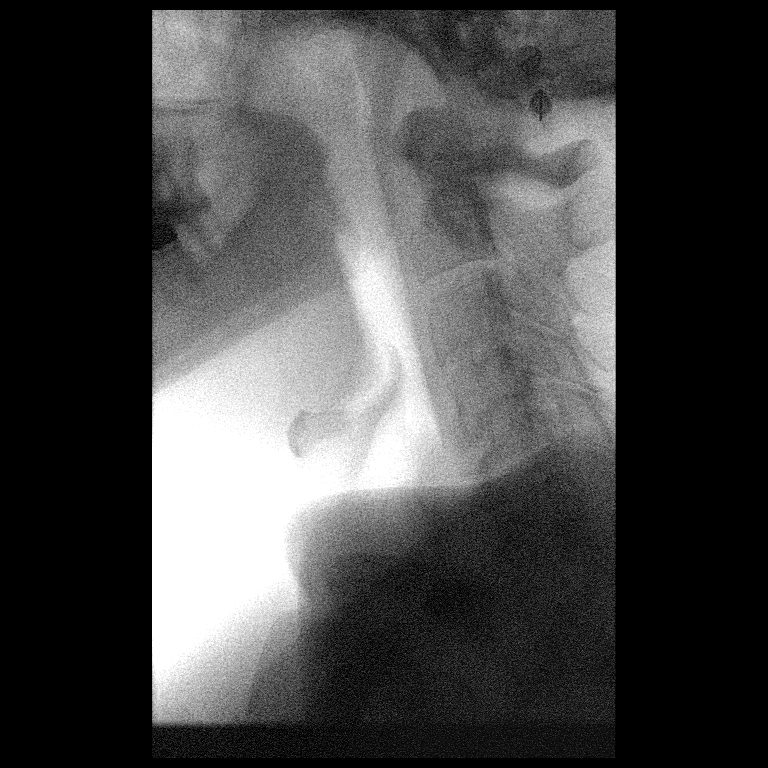

[Series 3: cp_standard · 0.35mm/px · 3 of 59 frames shown (2 of 7)]
[frame 3/59]
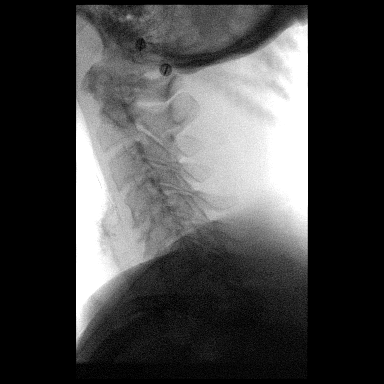
[frame 9/59]
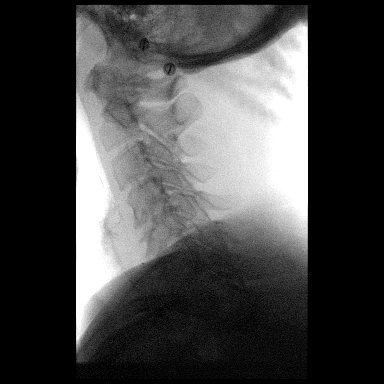
[frame 30/59]
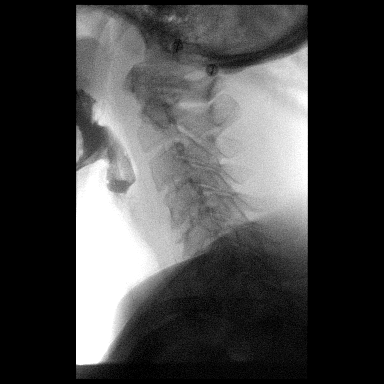

[Series 4: fluoro_barium 2fps_bw · 0.17mm/px · 2 of 5 frames shown]
[frame 1/5]
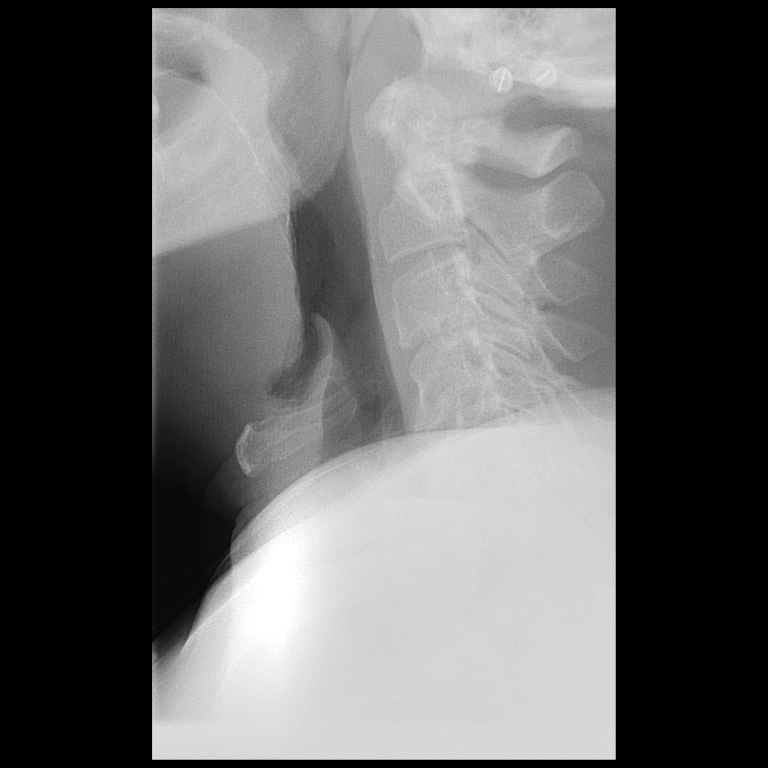
[frame 3/5]
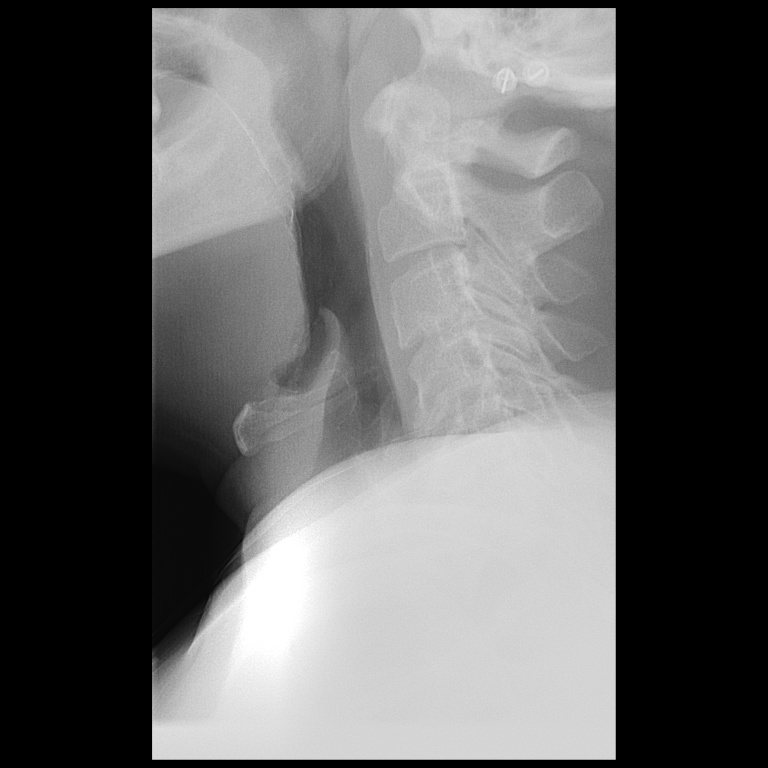

[Series 5: cp_standard · 0.34mm/px · 3 of 61 frames shown (3 of 7)]
[frame 1/61]
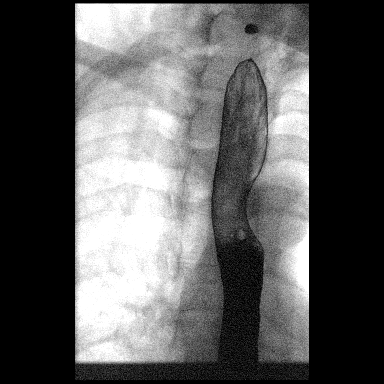
[frame 10/61]
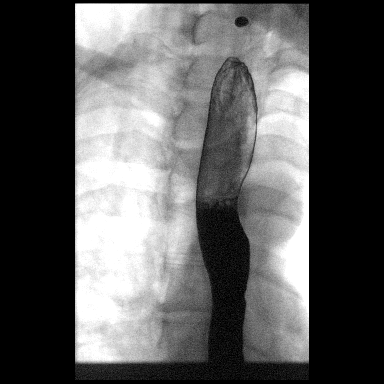
[frame 31/61]
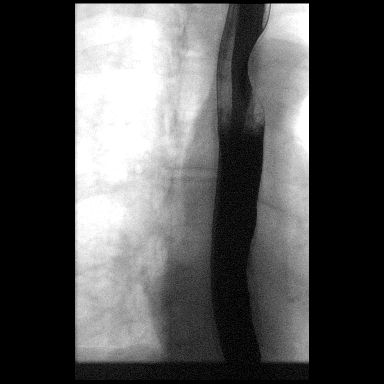

[Series 6: cp_standard · 0.17mm/px · 1 of 1 slices shown (4 of 7)]
[im 1/1]
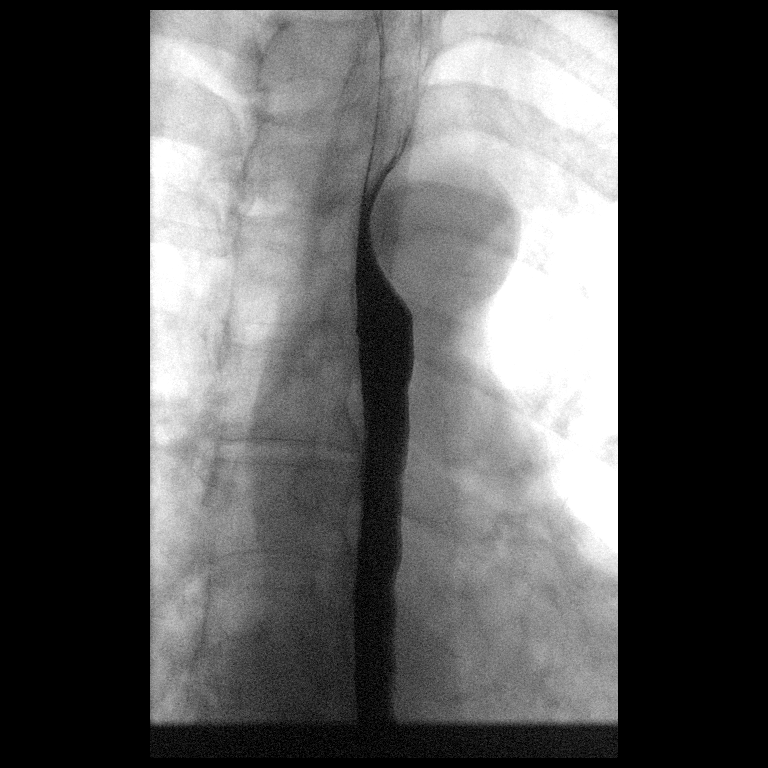

[Series 7: cp_standard · 0.17mm/px · 1 of 1 slices shown (5 of 7)]
[im 1/1]
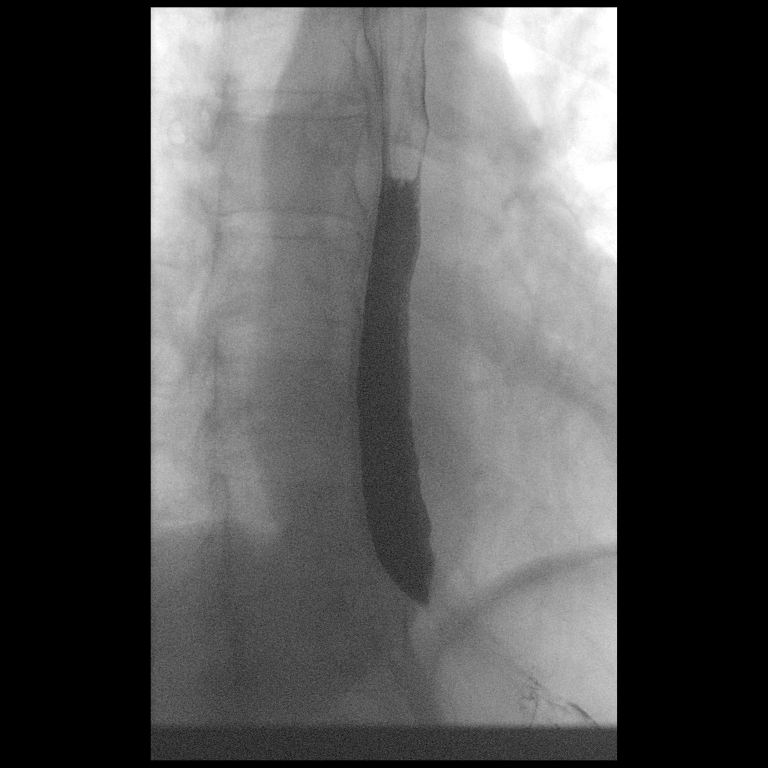

[Series 8: cp_standard · 0.17mm/px · 1 of 1 slices shown (6 of 7)]
[im 1/1]
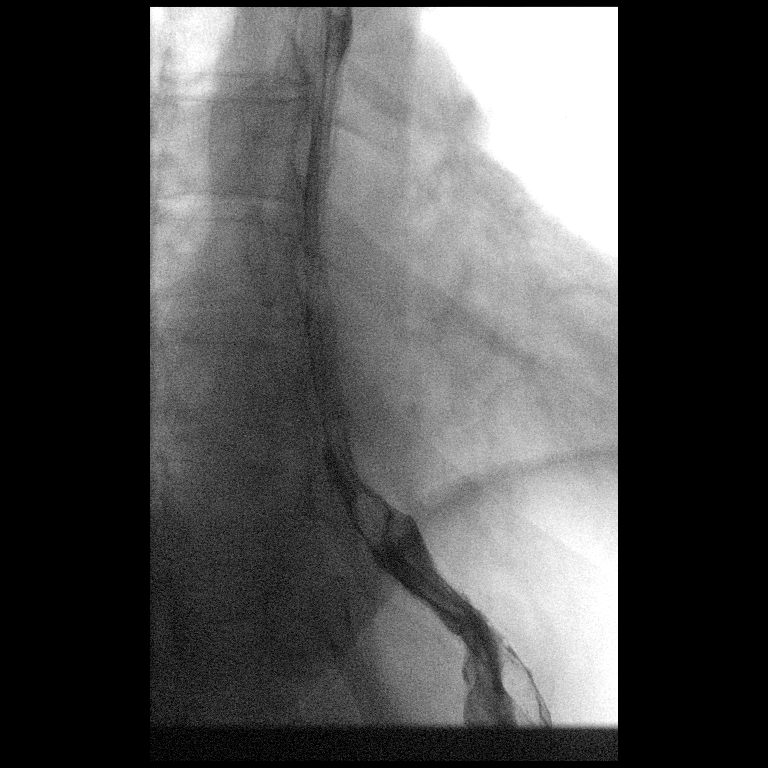

[Series 10: cp_standard · 0.25mm/px · 1 of 1 slices shown (7 of 7)]
[im 1/1]
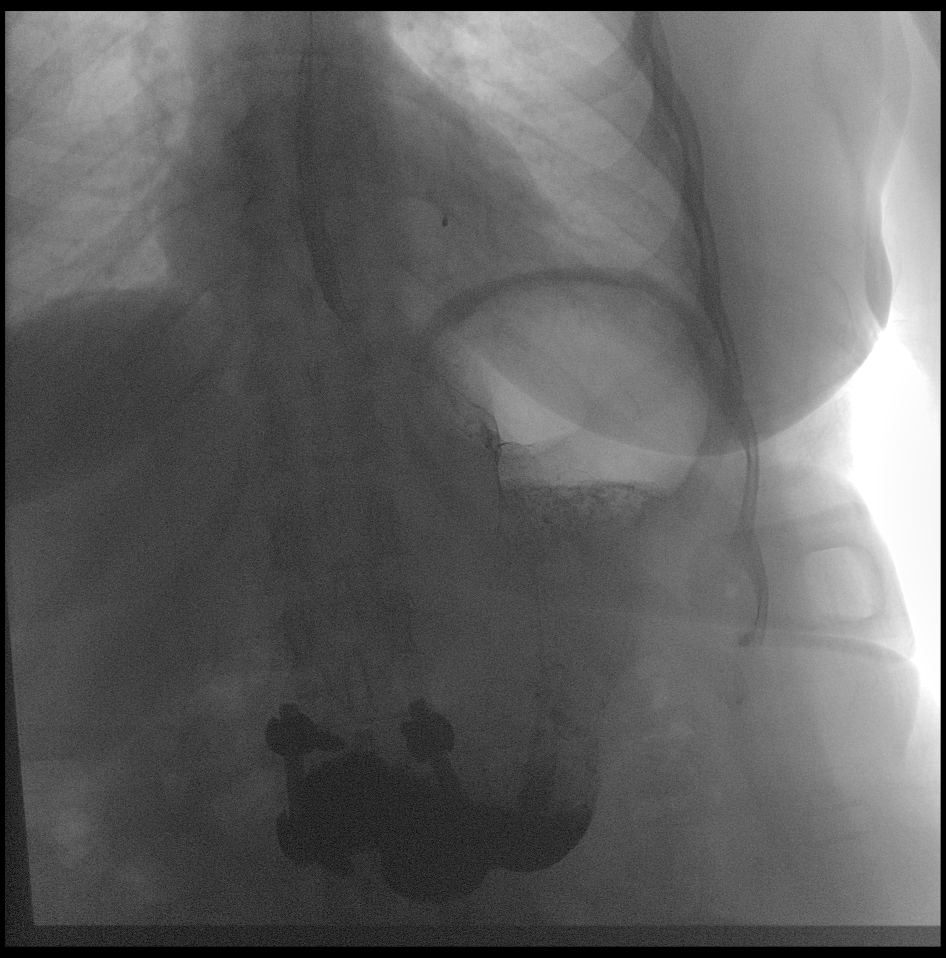

[13 of 18 positions shown; findings below may reference images not displayed]

FINDINGS: Patient swallowed thin barium solution without difficulty. Silent
laryngeal penetration was observed with rapid drinking. No filling
defects or mucosal irregularity of the esophagus. However hesitant
contractions and delayed clearing of the contrast from the esophagus
was seen. No functional obstruction at the GE junction. The 13 mm
barium tablet passed to the stomach without delay.

The study was prematurely terminated, due to patient severe nausea.
IMPRESSION: Limited study, which was modified and shortened due to patient's
severe nausea.

Normal anatomic appearance of the esophagus with tertiary
contractions and delayed clearing of the contrast.

Silent laryngeal penetration was observed with rapid drinking of
thin barium. Formal swallowing function study may be considered for
further evaluation.

## 2020-06-22 DEATH — deceased
